# Patient Record
Sex: Male | Born: 1975 | Race: Black or African American | Hispanic: No | Smoking: Never smoker
Health system: Southern US, Community
[De-identification: ages and names within clinical notes are randomized; demographics above are authoritative.]

## PROBLEM LIST (undated history)

## (undated) ENCOUNTER — Emergency Department (HOSPITAL_COMMUNITY): Discharge status: Against Medical Advice | Payer: Self-pay

---

## 2006-05-01 ENCOUNTER — Ambulatory Visit: Payer: Self-pay | Admitting: Family Medicine

## 2006-08-07 ENCOUNTER — Emergency Department: Payer: Self-pay | Admitting: Emergency Medicine

## 2006-08-17 ENCOUNTER — Ambulatory Visit: Payer: Self-pay | Admitting: Family Medicine

## 2006-08-17 DIAGNOSIS — K625 Hemorrhage of anus and rectum: Secondary | ICD-10-CM | POA: Insufficient documentation

## 2006-08-17 DIAGNOSIS — K644 Residual hemorrhoidal skin tags: Secondary | ICD-10-CM | POA: Insufficient documentation

## 2006-11-19 ENCOUNTER — Encounter (INDEPENDENT_AMBULATORY_CARE_PROVIDER_SITE_OTHER): Payer: Self-pay | Admitting: Internal Medicine

## 2006-11-30 ENCOUNTER — Ambulatory Visit: Payer: Self-pay | Admitting: Internal Medicine

## 2006-11-30 DIAGNOSIS — M79609 Pain in unspecified limb: Secondary | ICD-10-CM | POA: Insufficient documentation

## 2006-12-04 ENCOUNTER — Encounter (INDEPENDENT_AMBULATORY_CARE_PROVIDER_SITE_OTHER): Payer: Self-pay | Admitting: Internal Medicine

## 2006-12-06 ENCOUNTER — Encounter (INDEPENDENT_AMBULATORY_CARE_PROVIDER_SITE_OTHER): Payer: Self-pay | Admitting: Internal Medicine

## 2007-01-14 ENCOUNTER — Encounter (INDEPENDENT_AMBULATORY_CARE_PROVIDER_SITE_OTHER): Payer: Self-pay | Admitting: *Deleted

## 2007-02-11 ENCOUNTER — Ambulatory Visit: Payer: Self-pay | Admitting: Family Medicine

## 2007-04-05 ENCOUNTER — Emergency Department (HOSPITAL_COMMUNITY): Admission: EM | Admit: 2007-04-05 | Discharge: 2007-04-06 | Payer: Self-pay | Admitting: Emergency Medicine

## 2007-05-23 ENCOUNTER — Encounter (INDEPENDENT_AMBULATORY_CARE_PROVIDER_SITE_OTHER): Payer: Self-pay | Admitting: Internal Medicine

## 2008-02-24 ENCOUNTER — Emergency Department (HOSPITAL_COMMUNITY): Admission: EM | Admit: 2008-02-24 | Discharge: 2008-02-24 | Payer: Self-pay | Admitting: Emergency Medicine

## 2008-04-05 ENCOUNTER — Emergency Department (HOSPITAL_COMMUNITY): Admission: EM | Admit: 2008-04-05 | Discharge: 2008-04-05 | Payer: Self-pay | Admitting: Family Medicine

## 2008-06-16 ENCOUNTER — Emergency Department (HOSPITAL_COMMUNITY): Admission: EM | Admit: 2008-06-16 | Discharge: 2008-06-16 | Payer: Self-pay | Admitting: Family Medicine

## 2008-07-22 ENCOUNTER — Emergency Department (HOSPITAL_COMMUNITY): Admission: EM | Admit: 2008-07-22 | Discharge: 2008-07-22 | Payer: Self-pay | Admitting: Family Medicine

## 2008-07-27 ENCOUNTER — Emergency Department (HOSPITAL_COMMUNITY): Admission: EM | Admit: 2008-07-27 | Discharge: 2008-07-27 | Payer: Self-pay | Admitting: Family Medicine

## 2008-07-31 ENCOUNTER — Emergency Department: Payer: Self-pay | Admitting: Emergency Medicine

## 2008-08-02 ENCOUNTER — Emergency Department (HOSPITAL_COMMUNITY): Admission: EM | Admit: 2008-08-02 | Discharge: 2008-08-02 | Payer: Self-pay | Admitting: Family Medicine

## 2008-08-05 ENCOUNTER — Ambulatory Visit: Payer: Self-pay | Admitting: Family Medicine

## 2008-08-10 ENCOUNTER — Ambulatory Visit: Payer: Self-pay | Admitting: Psychiatry

## 2008-08-10 ENCOUNTER — Telehealth: Payer: Self-pay | Admitting: Family Medicine

## 2008-08-10 ENCOUNTER — Inpatient Hospital Stay (HOSPITAL_COMMUNITY): Admission: EM | Admit: 2008-08-10 | Discharge: 2008-08-13 | Payer: Self-pay | Admitting: Psychiatry
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
## 2010-06-13 LAB — CBC
HCT: 39.3 % (ref 39.0–52.0)
Hemoglobin: 13.1 g/dL (ref 13.0–17.0)
MCV: 90.1 fL (ref 78.0–100.0)
Platelets: 184 10*3/uL (ref 150–400)
RDW: 12.9 % (ref 11.5–15.5)

## 2010-06-13 LAB — TSH: TSH: 5.056 u[IU]/mL — ABNORMAL HIGH (ref 0.350–4.500)

## 2010-06-13 LAB — COMPREHENSIVE METABOLIC PANEL
Alkaline Phosphatase: 51 U/L (ref 39–117)
BUN: 12 mg/dL (ref 6–23)
Chloride: 105 mEq/L (ref 96–112)
Creatinine, Ser: 1.29 mg/dL (ref 0.4–1.5)
Glucose, Bld: 94 mg/dL (ref 70–99)
Potassium: 3.9 mEq/L (ref 3.5–5.1)
Total Bilirubin: 1 mg/dL (ref 0.3–1.2)
Total Protein: 6.7 g/dL (ref 6.0–8.3)

## 2010-06-15 LAB — POCT I-STAT, CHEM 8
Calcium, Ion: 1.15 mmol/L (ref 1.12–1.32)
HCT: 45 % (ref 39.0–52.0)
Sodium: 140 mEq/L (ref 135–145)
TCO2: 24 mmol/L (ref 0–100)

## 2010-06-15 LAB — D-DIMER, QUANTITATIVE: D-Dimer, Quant: 0.3 ug/mL-FEU (ref 0.00–0.48)

## 2010-06-20 LAB — GC/CHLAMYDIA PROBE AMP, GENITAL
Chlamydia, DNA Probe: NEGATIVE
GC Probe Amp, Genital: POSITIVE — AB

## 2010-07-22 NOTE — Discharge Summary (Signed)
NAME:  MCDANIEL, OHMS NO.:  [id number]   MEDICAL RECORD NO.:  [id number]          PATIENT TYPE:  IPS   LOCATION:  0501                          FACILITY:  [facility]   PHYSICIAN:  Geoffery Lyons, M.D.      DATE OF BIRTH:  01/04/1976   DATE OF ADMISSION:  08/10/2008  DATE OF DISCHARGE:  08/13/2008                               DISCHARGE SUMMARY   CHIEF COMPLAINT/HISTORY OF PRESENT ILLNESS:  This was the first  admission to [facility] for this 35 year old male  married, voluntarily admitted, thoughts of suicide for the last week,  thinking of how he might do this.  Considering several ways, mostly  stabbing and cutting.  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  No  previous attempts, issues with wife, marital infidelity.  She lost her  job, and since then she has been making attempts to find one.  He goes  to work one and a half hours away commute to 12 hours shifts.   PAST PSYCHIATRIC HISTORY:  [facility] time at [facility].  No prior  treatment.  Has had some marital counseling from the counseling from the  pastor.   ALCOHOL AND DRUG HABITS:  Denies active use of any substances.   MEDICAL HISTORY:  Noncontributory.   MEDICATIONS:  None.   PHYSICAL EXAMINATION:  Exam failed to show any acute findings.   LABORATORY WORK:  Mcilvain blood cells 5.5, hemoglobin 13.1, sodium 138,  potassium 3.9, glucose 94, SGOT 17, SGPT 12, total bilirubin 1.0, TSH  5.056.   MENTAL STATUS EXAM:  Reveals alert cooperative male.  Mood depressed.  Affect depressed.  Thought processes logical, coherent and relevant.  Some psychomotor retardation, feeling overwhelmed, suicidal ruminations.  No active plan at the time of this assessment.  No homicidal ideas.  No  delusions.  No hallucinations.  Cognition well-preserved.   ADMISSION DIAGNOSES:  AXIS I:  Major depressive disorder.  AXIS II:  No diagnosis.  AXIS III:  No diagnosis.  AXIS IV: Moderate.  AXIS V:  On admission  35-40, in the last year 70.   COURSE IN THE HOSPITAL:  Was admitted, started individual and group  psychotherapy.  He then endorsed increased depression, father killed  himself, stress from work, stress from the relationship with the wife  due to her infidelity.  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  June 9, endorsed that he realized he got overwhelmed,  hopeless, helpless.  Endorsed that he eventually will need to get  another job.  Would like to transfer. 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  He  was planning to work on relationship with his wife getting set to feel  better.  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  On June 18, he has made turn around.  His mood  was improved.  His affect was brighter.  She was smiling.  Endorsed he  was feeling so much better.  He was willing to pursue outpatient  treatment.  Felt that he was clear in terms of what he needed to do to  get his life back together.  We went ahead and discharged to outpatient  follow-up.   DISCHARGE DIAGNOSES:  AXIS I:  Major depressive disorder.  AXIS II:  No diagnosis.  AXIS III:  No diagnosis.  AXIS IV:  Moderate.  AXIS V:  On discharge 50-55.   DISCHARGE MEDICATIONS:  Discharged on no medications.   FOLLOWUP:  Follow-up through the [facility] counseling.      Geoffery Lyons, M.D.  Electronically Signed     IL/MEDQ  D:  09/10/2008  T:  09/10/2008  Job:  [id number]

## 2010-07-22 NOTE — Assessment & Plan Note (Signed)
[name]                           [place] OFFICE NOTE   NAME:Evan [name], Evan [name]                          MRN:          [id number]  DATE:05/01/2006                            DOB:          04/13/1975    Evan [name] is a 35 year old black male who comes to establish with the  practice, needing followup of pneumonia.  He indicates that he was seen  at the urgent care on April 25, 2006, and by chest x-ray diagnosed  with pneumonia.  He was given clarithromycin and Tamiflu.  He indicates  that he is having no chills now but is still coughing and the mucus is  thinner.   CURRENT MEDICATIONS:  None other than chronic.   ALLERGIES:  None.   PAST MEDICAL HISTORY:  Significant for chicken pox, no measles or mumps,  no other acute or chronic illness.  His only surgery is arthroscopic of  the left knee.  He has had no hospitalizations.   SOCIAL HISTORY:  He is married and has a 19-month-old son.  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  He does cardio and weight lifting  three times a week.  Has never smoked, occasionally uses alcohol, and no  street drugs.   REVIEW OF SYSTEMS:  Basically negative including HEENT, cardiovascular,  respiratory, GI, and GU.  He does indicate that he checks his testicles  on a weekly basis.  MUSCULOSKELETAL:  He had had fractured ankles  bilaterally but no other musculoskeletal problem.  He has had no  thromboses or allergic rhinitis.   FAMILY HISTORY:  Father died at age 54.  He was shot to death.  He had  diabetes.  Mother is living at age 40 with hypertension.  Maternal  grandmother died of stomach cancer.  Paternal grandfather died at 68 of  old age.  Paternal grandmother died in childbirth.  He has two brothers  living - one has GERD, the other is well.  He has one sister living with  hypertension.   With questions regarding the extended family find that there are no  other acute or chronic illnesses to  his knowledge in the family.   IMMUNIZATION RECORD:  His last tetanus was less than 5 years ago.  He  has not had the hepatitis B or pneumonia vaccine.   PHYSICAL EXAMINATION:  VITAL SIGNS:  Blood pressure 102/69, temperature  98.0, pulse is 96, weight is 298, height 5 feet 9-and-a-half inches.  GENERAL:  Obese black male in no acute distress.  HEENT:  TMs retracted bilaterally with fluid present.  Nasal mucosa is  erythematous and edematous.  Posterior pharynx is erythematous, no  exudate.  Facial sinus are not tender.  He has no lymphadenopathy of the  neck.  CHEST:  Clear.  No rales, rhonchi or wheezes are heard.  He does have a  moist, harsh cough which is not productive at this time.  HEART:  Rate and rhythm regular without murmurs, gallops or rubs.  No  carotid bruits.  No pretibial edema.  MUSCULOSKELETAL:  Muscle mass is  well developed throughout.  Gait and  station within normal limits.  SKIN:  Without obvious lesions in the exposed areas.  PSYCHIATRIC:  Oriented x3.  Verbalizes easily.  Is a good historian.   ASSESSMENT:  1. Pneumonia which is resolving.  He will complete the Biaxin and see      back in a week for followup.  Increase his p.o. fluids and may      return to work as needed.  2. Obesity.  This was not addressed at this time.      Billie D. Bean, FNP  Electronically Signed      Arta Silence, MD  Electronically Signed   BDB/MedQ  DD: 05/08/2006  DT: 05/08/2006  Job #: [id number]

## 2011-08-21 ENCOUNTER — Ambulatory Visit (HOSPITAL_COMMUNITY): Admit: 2011-08-21 | Payer: Self-pay | Admitting: Gastroenterology

## 2011-08-21 ENCOUNTER — Encounter (HOSPITAL_COMMUNITY): Payer: Self-pay

## 2011-08-21 SURGERY — EGD (ESOPHAGOGASTRODUODENOSCOPY)
Anesthesia: Moderate Sedation

## 2011-10-25 ENCOUNTER — Emergency Department: Payer: Self-pay | Admitting: Emergency Medicine

## 2013-03-14 ENCOUNTER — Ambulatory Visit: Payer: Self-pay | Admitting: Internal Medicine

## 2013-03-14 DIAGNOSIS — Z0289 Encounter for other administrative examinations: Secondary | ICD-10-CM

## 2019-03-06 ENCOUNTER — Emergency Department (HOSPITAL_COMMUNITY)
Admission: EM | Admit: 2019-03-06 | Discharge: 2019-03-06 | Disposition: A | Discharge status: Home / Self Care | Payer: Self-pay | Attending: Emergency Medicine | Admitting: Emergency Medicine

## 2019-03-06 ENCOUNTER — Encounter (HOSPITAL_COMMUNITY): Payer: Self-pay | Admitting: Emergency Medicine

## 2019-03-06 ENCOUNTER — Other Ambulatory Visit: Payer: Self-pay

## 2019-03-06 DIAGNOSIS — R42 Dizziness and giddiness: Secondary | ICD-10-CM | POA: Insufficient documentation

## 2019-03-06 LAB — CBC
HCT: 43.1 % (ref 39.0–52.0)
Hemoglobin: 13.6 g/dL (ref 13.0–17.0)
MCH: 28.5 pg (ref 26.0–34.0)
MCHC: 31.6 g/dL (ref 30.0–36.0)
MCV: 90.4 fL (ref 80.0–100.0)
Platelets: 202 10*3/uL (ref 150–400)
RBC: 4.77 MIL/uL (ref 4.22–5.81)
RDW: 13.9 % (ref 11.5–15.5)
WBC: 5.4 10*3/uL (ref 4.0–10.5)
nRBC: 0 % (ref 0.0–0.2)

## 2019-03-06 LAB — BASIC METABOLIC PANEL
Anion gap: 10 (ref 5–15)
BUN: 14 mg/dL (ref 6–20)
CO2: 23 mmol/L (ref 22–32)
Calcium: 8.6 mg/dL — ABNORMAL LOW (ref 8.9–10.3)
Chloride: 103 mmol/L (ref 98–111)
Creatinine, Ser: 1.07 mg/dL (ref 0.61–1.24)
GFR calc Af Amer: >60 mL/min (ref >60)
GFR calc non Af Amer: >60 mL/min (ref >60)
Glucose, Bld: 82 mg/dL (ref 70–99)
Potassium: 4.4 mmol/L (ref 3.5–5.1)
Sodium: 136 mmol/L (ref 135–145)

## 2019-03-06 LAB — CBG MONITORING, ED: Glucose-Capillary: 79 mg/dL (ref 70–99)

## 2019-03-06 NOTE — ED Provider Notes (Signed)
[facility]-EMERGENCY DEPT Provider Note   CSN: [id number] Arrival date & time: 03/06/19  1315     History Chief Complaint  Patient presents with  . Dizziness    Evan [name] is a 43 y.o. male.  Presents to ER with chief complaint lightheadedness.  Episode of lightheadedness occurred after his friend made him a very sugary smoothie.  States he normally does not have smoothies like this, normally does not eat somewhat sugar.  No alcoholic drink.  States felt lightheaded for about an hour then this resolved spontaneously.  No syncope, no room spinning sensation, no numbness, weakness, vision changes, no chest pain or difficulty breathing.  States he feels fine at this time and has no ongoing complaints.  Denies any chronic medical problems.  No known food allergies.  HPI     History reviewed. No pertinent past medical history.  There are no problems to display for this patient.   History reviewed. No pertinent surgical history.     No family history on file.  Social History   Tobacco Use  . Smoking status: Never Smoker  . Smokeless tobacco: Never Used  Substance Use Topics  . Alcohol use: Yes  . Drug use: Not on file    Home Medications Prior to Admission medications   Not on File    Allergies    Penicillins  Review of Systems   Review of Systems  Constitutional: Negative for chills and fever.  HENT: Negative for ear pain and sore throat.   Eyes: Negative for pain and visual disturbance.  Respiratory: Negative for cough and shortness of breath.   Cardiovascular: Negative for chest pain and palpitations.  Gastrointestinal: Negative for abdominal pain and vomiting.  Genitourinary: Negative for dysuria and hematuria.  Musculoskeletal: Negative for arthralgias and back pain.  Skin: Negative for color change and rash.  Neurological: Positive for light-headedness. Negative for seizures and syncope.  All other systems reviewed and are  negative.   Physical Exam Updated Vital Signs BP (!) 130/56 (BP Location: Left Arm)   Pulse 75   Temp 98.1 F (36.7 C) (Oral)   Resp 18   SpO2 98%   Physical Exam Vitals and nursing note reviewed.  Constitutional:      Appearance: He is well-developed.  HENT:     Head: Normocephalic and atraumatic.  Eyes:     Conjunctiva/sclera: Conjunctivae normal.  Cardiovascular:     Rate and Rhythm: Normal rate and regular rhythm.     Heart sounds: No murmur.  Pulmonary:     Effort: Pulmonary effort is normal. No respiratory distress.     Breath sounds: Normal breath sounds.  Abdominal:     Palpations: Abdomen is soft.     Tenderness: There is no abdominal tenderness.  Musculoskeletal:     Cervical back: Neck supple.  Skin:    General: Skin is warm and dry.  Neurological:     General: No focal deficit present.     Mental Status: He is alert and oriented to person, place, and time. Mental status is at baseline.     Cranial Nerves: No cranial nerve deficit.     Sensory: No sensory deficit.     Motor: No weakness.     Coordination: Coordination normal.     Gait: Gait normal.  Psychiatric:        Mood and Affect: Mood normal.        Behavior: Behavior normal.     ED Results / Procedures /  Treatments   Labs (all labs ordered are listed, but only abnormal results are displayed) Labs Reviewed  BASIC METABOLIC PANEL - Abnormal; Notable for the following components:      Result Value   Calcium 8.6 (*)    All other components within normal limits  CBC  URINALYSIS, ROUTINE W REFLEX MICROSCOPIC  CBG MONITORING, ED    EKG EKG Interpretation  Date/Time:  Thursday March 06 2019 13:23:43 EST Ventricular Rate:  65 PR Interval:    QRS Duration: 99 QT Interval:  403 QTC Calculation: 419 R Axis:   1 Text Interpretation: Sinus rhythm Abnormal R-wave progression, early transition Inferior infarct, old Borderline ST elevation, anterolateral leads no acute STEMI no prior for  comparison Confirmed by Madalyn Rob [phone]) on 03/06/2019 3:10:46 PM   Radiology No results found.  Procedures Procedures (including critical care time)  Medications Ordered in ED Medications - No data to display  ED Course  I have reviewed the triage vital signs and the nursing notes.  Pertinent labs & imaging results that were available during my care of the patient were reviewed by me and considered in my medical decision making (see chart for details).  Clinical Course as of Mar 05 1702  Thu Mar 06, 2019  1602 Complete initial assessment, patient well-appearing, normal exam, will discharge home, no ongoing symptoms   [RD]    Clinical Course User Index [RD] Lucrezia Starch, MD   MDM Rules/Calculators/A&P                      43 year old male presents to ER after episode of lightheadedness after trying new smoothie drink.  Episode resolved spontaneously, he has no ongoing symptoms, no neurologic deficits, no associated chest pain or difficulty breathing, no syncope.  His vital signs are stable.  Believe patient can be discharged at this time and follow-up with his primary doctor.    After the discussed management above, the patient was determined to be safe for discharge.  The patient was in agreement with this plan and all questions regarding their care were answered.  ED return precautions were discussed and the patient will return to the ED with any significant worsening of condition.   Final Clinical Impression(s) / ED Diagnoses Final diagnoses:  Lightheadedness    Rx / DC Orders ED Discharge Orders    None       Lucrezia Starch, MD 03/06/19 1704

## 2019-03-06 NOTE — ED Triage Notes (Signed)
Pt reports that his friend fixed him a smoothie. Ever since he drank it felt dizzy for the past hour. Reports that hasnt had refined sugars in a while and doesn't know if that is the cause.

## 2019-03-06 NOTE — Discharge Instructions (Signed)
Recommend follow-up with primary doctor.  Return to ER if you have any episode of chest pain, difficulty breathing, passing out, dizziness or other new concerning symptom.

## 2019-03-06 NOTE — ED Notes (Signed)
Patient has a extra blue top in the main lab 

## 2020-01-02 ENCOUNTER — Inpatient Hospital Stay (HOSPITAL_COMMUNITY)
Admission: EM | Admit: 2020-01-02 | Discharge: 2020-01-06 | DRG: 177 | Disposition: A | Discharge status: Home / Self Care | Payer: Medicaid - Out of State | Attending: Internal Medicine | Admitting: Internal Medicine

## 2020-01-02 ENCOUNTER — Emergency Department (HOSPITAL_COMMUNITY): Payer: Medicaid - Out of State

## 2020-01-02 ENCOUNTER — Other Ambulatory Visit: Payer: Self-pay

## 2020-01-02 ENCOUNTER — Encounter (HOSPITAL_COMMUNITY): Payer: Self-pay | Admitting: Emergency Medicine

## 2020-01-02 DIAGNOSIS — N182 Chronic kidney disease, stage 2 (mild): Secondary | ICD-10-CM

## 2020-01-02 DIAGNOSIS — J1282 Pneumonia due to coronavirus disease 2019: Secondary | ICD-10-CM | POA: Diagnosis present

## 2020-01-02 DIAGNOSIS — E669 Obesity, unspecified: Secondary | ICD-10-CM

## 2020-01-02 DIAGNOSIS — U071 COVID-19: Secondary | ICD-10-CM | POA: Diagnosis present

## 2020-01-02 DIAGNOSIS — Z880 Allergy status to penicillin: Secondary | ICD-10-CM

## 2020-01-02 DIAGNOSIS — Z6841 Body Mass Index (BMI) 40.0 and over, adult: Secondary | ICD-10-CM | POA: Diagnosis not present

## 2020-01-02 DIAGNOSIS — J9601 Acute respiratory failure with hypoxia: Secondary | ICD-10-CM | POA: Diagnosis present

## 2020-01-02 DIAGNOSIS — E66812 Obesity, class 2: Secondary | ICD-10-CM

## 2020-01-02 DIAGNOSIS — D631 Anemia in chronic kidney disease: Secondary | ICD-10-CM | POA: Diagnosis present

## 2020-01-02 LAB — CBC WITH DIFFERENTIAL/PLATELET
Abs Immature Granulocytes: 0.02 10*3/uL (ref 0.00–0.07)
Basophils Absolute: 0 10*3/uL (ref 0.0–0.1)
Basophils Relative: 0 %
Eosinophils Absolute: 0 10*3/uL (ref 0.0–0.5)
Eosinophils Relative: 0 %
HCT: 37.6 % — ABNORMAL LOW (ref 39.0–52.0)
Hemoglobin: 12.3 g/dL — ABNORMAL LOW (ref 13.0–17.0)
Immature Granulocytes: 0 %
Lymphocytes Relative: 15 %
Lymphs Abs: 1 10*3/uL (ref 0.7–4.0)
MCH: 28.7 pg (ref 26.0–34.0)
MCHC: 32.7 g/dL (ref 30.0–36.0)
MCV: 87.6 fL (ref 80.0–100.0)
Monocytes Absolute: 0.4 10*3/uL (ref 0.1–1.0)
Monocytes Relative: 7 %
Neutro Abs: 5 10*3/uL (ref 1.7–7.7)
Neutrophils Relative %: 78 %
Platelets: 245 10*3/uL (ref 150–400)
RBC: 4.29 MIL/uL (ref 4.22–5.81)
RDW: 13.7 % (ref 11.5–15.5)
WBC: 6.4 10*3/uL (ref 4.0–10.5)
nRBC: 0 % (ref 0.0–0.2)

## 2020-01-02 LAB — COMPREHENSIVE METABOLIC PANEL
ALT: 31 U/L (ref 0–44)
AST: 56 U/L — ABNORMAL HIGH (ref 15–41)
Albumin: 3.8 g/dL (ref 3.5–5.0)
Alkaline Phosphatase: 31 U/L — ABNORMAL LOW (ref 38–126)
Anion gap: 13 (ref 5–15)
BUN: 12 mg/dL (ref 6–20)
CO2: 22 mmol/L (ref 22–32)
Calcium: 8.4 mg/dL — ABNORMAL LOW (ref 8.9–10.3)
Chloride: 101 mmol/L (ref 98–111)
Creatinine, Ser: 1.26 mg/dL — ABNORMAL HIGH (ref 0.61–1.24)
GFR, Estimated: >60 mL/min (ref >60)
Glucose, Bld: 96 mg/dL (ref 70–99)
Potassium: 3.9 mmol/L (ref 3.5–5.1)
Sodium: 136 mmol/L (ref 135–145)
Total Bilirubin: 1.6 mg/dL — ABNORMAL HIGH (ref 0.3–1.2)
Total Protein: 7.8 g/dL (ref 6.5–8.1)

## 2020-01-02 LAB — FERRITIN: Ferritin: 1484 ng/mL — ABNORMAL HIGH (ref 24–336)

## 2020-01-02 LAB — PROCALCITONIN: Procalcitonin: <0.1 ng/mL

## 2020-01-02 LAB — LACTIC ACID, PLASMA: Lactic Acid, Venous: 1 mmol/L (ref 0.5–1.9)

## 2020-01-02 LAB — TRIGLYCERIDES: Triglycerides: 86 mg/dL (ref <150)

## 2020-01-02 LAB — FIBRINOGEN: Fibrinogen: 628 mg/dL — ABNORMAL HIGH (ref 210–475)

## 2020-01-02 LAB — C-REACTIVE PROTEIN: CRP: 21.3 mg/dL — ABNORMAL HIGH (ref <1.0)

## 2020-01-02 LAB — D-DIMER, QUANTITATIVE: D-Dimer, Quant: 1.1 ug/mL-FEU — ABNORMAL HIGH (ref 0.00–0.50)

## 2020-01-02 LAB — LACTATE DEHYDROGENASE: LDH: 381 U/L — ABNORMAL HIGH (ref 98–192)

## 2020-01-02 MED ORDER — DEXAMETHASONE SODIUM PHOSPHATE 10 MG/ML IJ SOLN
6.0000 mg | INTRAMUSCULAR | Status: DC
  Administered 2020-01-03: 6 mg via INTRAVENOUS
  Filled 2020-01-02: qty 1

## 2020-01-02 MED ORDER — ONDANSETRON HCL 4 MG PO TABS: 4.0000 mg | ORAL_TABLET | Freq: Four times a day (QID) | ORAL | Status: DC | PRN

## 2020-01-02 MED ORDER — BENZONATATE 100 MG PO CAPS
100.0000 mg | ORAL_CAPSULE | Freq: Once | ORAL | Status: AC
  Administered 2020-01-03: 100 mg via ORAL
  Filled 2020-01-02: qty 1

## 2020-01-02 MED ORDER — ONDANSETRON HCL 4 MG/2ML IJ SOLN: 4.0000 mg | Freq: Four times a day (QID) | INTRAMUSCULAR | Status: DC | PRN

## 2020-01-02 MED ORDER — HEPARIN SODIUM (PORCINE) 5000 UNIT/ML IJ SOLN: 5000.0000 [IU] | Freq: Three times a day (TID) | INTRAMUSCULAR | Status: DC

## 2020-01-02 MED ORDER — POLYETHYLENE GLYCOL 3350 17 G PO PACK
17.0000 g | PACK | Freq: Every day | ORAL | Status: DC | PRN
  Administered 2020-01-05: 17 g via ORAL
  Filled 2020-01-02: qty 1

## 2020-01-02 MED ORDER — ZINC SULFATE 220 (50 ZN) MG PO CAPS
220.0000 mg | ORAL_CAPSULE | Freq: Every day | ORAL | Status: DC
  Administered 2020-01-03 – 2020-01-06 (×4): 220 mg via ORAL
  Filled 2020-01-02 (×4): qty 1

## 2020-01-02 MED ORDER — ACETAMINOPHEN 325 MG PO TABS
650.0000 mg | ORAL_TABLET | Freq: Four times a day (QID) | ORAL | Status: DC | PRN
  Administered 2020-01-03: 650 mg via ORAL
  Filled 2020-01-02 (×2): qty 2

## 2020-01-02 MED ORDER — TRAMADOL HCL 50 MG PO TABS: 50.0000 mg | ORAL_TABLET | Freq: Four times a day (QID) | ORAL | Status: DC | PRN

## 2020-01-02 MED ORDER — SODIUM CHLORIDE 0.9 % IV SOLN
100.0000 mg | Freq: Every day | INTRAVENOUS | Status: AC
  Administered 2020-01-03 – 2020-01-06 (×4): 100 mg via INTRAVENOUS
  Filled 2020-01-02 (×3): qty 20

## 2020-01-02 MED ORDER — IPRATROPIUM-ALBUTEROL 20-100 MCG/ACT IN AERS
1.0000 | INHALATION_SPRAY | Freq: Four times a day (QID) | RESPIRATORY_TRACT | Status: DC
  Administered 2020-01-03 – 2020-01-06 (×14): 1 via RESPIRATORY_TRACT
  Filled 2020-01-02: qty 4

## 2020-01-02 MED ORDER — GUAIFENESIN-DM 100-10 MG/5ML PO SYRP
10.0000 mL | ORAL_SOLUTION | ORAL | Status: DC | PRN
  Administered 2020-01-03 – 2020-01-05 (×6): 10 mL via ORAL
  Filled 2020-01-02 (×6): qty 10

## 2020-01-02 MED ORDER — HYDROCOD POLST-CPM POLST ER 10-8 MG/5ML PO SUER: 5.0000 mL | Freq: Two times a day (BID) | ORAL | Status: DC | PRN

## 2020-01-02 MED ORDER — SODIUM CHLORIDE 0.9 % IV SOLN
200.0000 mg | Freq: Once | INTRAVENOUS | Status: AC
  Administered 2020-01-02: 200 mg via INTRAVENOUS
  Filled 2020-01-02: qty 200

## 2020-01-02 MED ORDER — ACETAMINOPHEN 325 MG PO TABS
650.0000 mg | ORAL_TABLET | Freq: Once | ORAL | Status: AC
  Administered 2020-01-02: 650 mg via ORAL
  Filled 2020-01-02: qty 2

## 2020-01-02 MED ORDER — ASCORBIC ACID 500 MG PO TABS
500.0000 mg | ORAL_TABLET | Freq: Every day | ORAL | Status: DC
  Administered 2020-01-03 – 2020-01-06 (×4): 500 mg via ORAL
  Filled 2020-01-02 (×4): qty 1

## 2020-01-02 NOTE — ED Notes (Addendum)
ED TO INPATIENT HANDOFF REPORT  ED Nurse Name and Phone #: Merilyn 5W  S Name/Age/Gender Evan SayresJames [name] 44 y.o. male Room/Bed: WA20/WA20  Code Status   Code Status: Full Code  Home/SNF/Other Home Patient oriented to: self, place, time and situation Is this baseline? Yes   Triage Complete: Triage complete  Chief Complaint Pneumonia due to COVID-19 virus [U07.1, J12.82]  Triage Note Per EMS, patient from UC. Dx with Covid on 10/28 and [facility]. O2 saturation 84% while ambulating.    Allergies Allergies  Allergen Reactions  . Penicillins     States he is allergic to liquid penicillins    Level of Care/Admitting Diagnosis ED Disposition    ED Disposition Condition Comment   Admit  [name] [[id number]]  Level of Care: Telemetry [5]  Admit to tele based on following criteria: Other see comments  Comments: hypoxia  May [facility] to [facility] or [facility] if equivalent level of care is available:: Yes  Covid Evaluation: Confirmed COVID Positive  Diagnosis: Pneumonia due to COVID-19 virus [[id number]][[phone]]  Admitting Physician: Lilyan GilfordZIERLE-GHOSH, ASIA B [[id number]]  Attending Physician: Lilyan GilfordZIERLE-GHOSH, ASIA B [[id number]]  Estimated length of stay: past midnight tomorrow  Certification:: I certify this patient will need inpatient services for at least 2 midnights       B Medical/Surgery History History reviewed. No pertinent past medical history.    A IV Location/Drains/Wounds Patient Lines/Drains/Airways Status    Active Line/Drains/Airways    Name Placement date Placement time Site Days   Peripheral IV 01/02/20 Anterior;Distal;Right;Upper Arm 01/02/20  2045  Arm  less than 1          Intake/Output Last 24 hours No intake or output data in the 24 hours ending 01/02/20 2355  Labs/Imaging Results for orders placed or performed during the hospital encounter of 01/02/20 (from the past 48 hour(s))  Lactic acid, plasma      Status: None   Collection Time: 01/02/20  8:40 PM  Result Value Ref Range   Lactic Acid, Venous 1.0 0.5 - 1.9 mmol/L    Comment: Performed at [facility], [address] [place], [place] [address]  CBC WITH DIFFERENTIAL     Status: Abnormal   Collection Time: 01/02/20  8:40 PM  Result Value Ref Range   WBC 6.4 4.0 - 10.5 K/uL   RBC 4.29 4.22 - 5.81 MIL/uL   Hemoglobin 12.3 (L) 13.0 - 17.0 g/dL   HCT 65.737.6 (L) 39 - 52 %   MCV 87.6 80.0 - 100.0 fL   MCH 28.7 26.0 - 34.0 pg   MCHC 32.7 30.0 - 36.0 g/dL   RDW 84.613.7 96.211.5 - 95.215.5 %   Platelets 245 150 - 400 K/uL   nRBC 0.0 0.0 - 0.2 %   Neutrophils Relative % 78 %   Neutro Abs 5.0 1.7 - 7.7 K/uL   Lymphocytes Relative 15 %   Lymphs Abs 1.0 0.7 - 4.0 K/uL   Monocytes Relative 7 %   Monocytes Absolute 0.4 0.1 - 1.0 K/uL   Eosinophils Relative 0 %   Eosinophils Absolute 0.0 0.0 - 0.5 K/uL   Basophils Relative 0 %   Basophils Absolute 0.0 0.0 - 0.1 K/uL   Immature Granulocytes 0 %   Abs Immature Granulocytes 0.02 0.00 - 0.07 K/uL    Comment: Performed at [facility], [address] [place], [place] [address]  Comprehensive metabolic panel     Status: Abnormal  Collection Time: 01/02/20  8:40 PM  Result Value Ref Range   Sodium 136 135 - 145 mmol/L   Potassium 3.9 3.5 - 5.1 mmol/L   Chloride 101 98 - 111 mmol/L   CO2 22 22 - 32 mmol/L   Glucose, Bld 96 70 - 99 mg/dL    Comment: Glucose reference range applies only to samples taken after fasting for at least 8 hours.   BUN 12 6 - 20 mg/dL   Creatinine, Ser 7.90 (H) 0.61 - 1.24 mg/dL   Calcium 8.4 (L) 8.9 - 10.3 mg/dL   Total Protein 7.8 6.5 - 8.1 g/dL   Albumin 3.8 3.5 - 5.0 g/dL   AST 56 (H) 15 - 41 U/L   ALT 31 0 - 44 U/L   Alkaline Phosphatase 31 (L) 38 - 126 U/L   Total Bilirubin 1.6 (H) 0.3 - 1.2 mg/dL   GFR, Estimated >24 >09 mL/min    Comment: (NOTE) Calculated using the CKD-EPI Creatinine Equation (2021)    Anion gap 13 5 - 15     Comment: Performed at [facility], [address], [place], [place] [address]  D-dimer, quantitative     Status: Abnormal   Collection Time: 01/02/20  8:40 PM  Result Value Ref Range   D-Dimer, Quant 1.10 (H) 0.00 - 0.50 ug/mL-FEU    Comment: (NOTE) At the manufacturer cut-off value of 0.5 g/mL FEU, this assay has a negative predictive value of 95-100%.This assay is intended for use in conjunction with a clinical pretest probability (PTP) assessment model to exclude pulmonary embolism (PE) and deep venous thrombosis (DVT) in outpatients suspected of PE or DVT. Results should be correlated with clinical presentation. Performed at [facility], [address], [place], [place] [address]   Procalcitonin     Status: None   Collection Time: 01/02/20  8:40 PM  Result Value Ref Range   Procalcitonin <0.10 ng/mL    Comment:        Interpretation: PCT (Procalcitonin) <= 0.5 ng/mL: Systemic infection (sepsis) is not likely. Local bacterial infection is possible. (NOTE)       Sepsis PCT Algorithm           Lower Respiratory Tract                                      Infection PCT Algorithm    ----------------------------     ----------------------------         PCT < 0.25 ng/mL                PCT < 0.10 ng/mL          Strongly encourage             Strongly discourage   discontinuation of antibiotics    initiation of antibiotics    ----------------------------     -----------------------------       PCT 0.25 - 0.50 ng/mL            PCT 0.10 - 0.25 ng/mL               OR       >80% decrease in PCT            Discourage initiation of  antibiotics      Encourage discontinuation           of antibiotics    ----------------------------     -----------------------------         PCT >= 0.50 ng/mL              PCT 0.26 - 0.50 ng/mL               AND        <80% decrease in PCT             Encourage initiation  of                                             antibiotics       Encourage continuation           of antibiotics    ----------------------------     -----------------------------        PCT >= 0.50 ng/mL                  PCT > 0.50 ng/mL               AND         increase in PCT                  Strongly encourage                                      initiation of antibiotics    Strongly encourage escalation           of antibiotics                                     -----------------------------                                           PCT <= 0.25 ng/mL                                                 OR                                        > 80% decrease in PCT                                      Discontinue / Do not initiate                                             antibiotics  Performed at [facility], [address] [place], [place] [address]   Lactate dehydrogenase     Status: Abnormal   Collection  Time: 01/02/20  8:40 PM  Result Value Ref Range   LDH 381 (H) 98 - 192 U/L    Comment: Performed at [facility], [address], [place], [place] [address]  Ferritin     Status: Abnormal   Collection Time: 01/02/20  8:40 PM  Result Value Ref Range   Ferritin 1,484 (H) 24 - 336 ng/mL    Comment: Performed at [facility], [address] [place], [place] [address]  Triglycerides     Status: None   Collection Time: 01/02/20  8:40 PM  Result Value Ref Range   Triglycerides 86 <150 mg/dL    Comment: Performed at [facility], [name], [place], [place] [address]  Fibrinogen     Status: Abnormal   Collection Time: 01/02/20  8:40 PM  Result Value Ref Range   Fibrinogen 628 (H) 210 - 475 mg/dL    Comment: Performed at [facility], [address], [place], [place] [address]  C-reactive protein     Status: Abnormal   Collection Time: 01/02/20  8:40 PM  Result Value Ref  Range   CRP 21.3 (H) <1.0 mg/dL    Comment: Performed at [facility] [address] [place], [place] [address]   DG Chest Portable 1 View  Result Date: 01/02/2020 CLINICAL DATA:  Recent COVID diagnosis. EXAM: PORTABLE CHEST 1 VIEW COMPARISON:  None. FINDINGS: Decreased lung volumes are seen which is likely, in part, secondary to the degree of patient inspiration. Very mild, ill-defined infiltrates are seen within the bilateral lung bases. There is no evidence of a pleural effusion or pneumothorax. The heart size and mediastinal contours are within normal limits. The visualized skeletal structures are unremarkable. IMPRESSION: Very mild, ill-defined bibasilar infiltrates. Electronically Signed   By: Aram Candela M.D.   On: 01/02/2020 22:26    Pending Labs Unresulted Labs (From admission, onward)          Start     Ordered   01/03/20 0500  CBC with Differential/Platelet  Daily,   R      01/02/20 2238   01/03/20 0500  Comprehensive metabolic panel  Daily,   R      01/02/20 2238   01/03/20 0500  C-reactive protein  Daily,   R      01/02/20 2238   01/03/20 0500  D-dimer, quantitative (not at [facility])  Daily,   R      01/02/20 2238   01/03/20 0500  Ferritin  Daily,   R      01/02/20 2238   01/03/20 0500  Magnesium  Daily,   R      01/02/20 2238   01/02/20 2239  HIV Antibody (routine testing w rflx)  (HIV Antibody (Routine testing w reflex) panel)  Once,   STAT        01/02/20 2238   01/02/20 2239  SARS CORONAVIRUS 2 (TAT 6-24 HRS) Nasopharyngeal Nasopharyngeal Swab  Once,   STAT       Question Answer Comment  Is this test for diagnosis or screening Diagnosis of ill patient   Symptomatic for COVID-19 as defined by CDC Yes   Date of Symptom Onset 12/29/2019   Hospitalized for COVID-19 Yes   Admitted to ICU for COVID-19 No   Previously tested for COVID-19 No   Resident in a congregate (group) care setting No   Employed in healthcare setting Unknown   Has patient  completed COVID vaccination(s) (2  doses of Pfizer/Moderna 1 dose of [employer]) Unknown      01/02/20 2238   01/02/20 2239  Procalcitonin  Once,   STAT        01/02/20 2238   01/02/20 1906  Lactic acid, plasma  Now then every 2 hours,   STAT      01/02/20 1905   01/02/20 1906  Blood Culture (routine x 2)  BLOOD CULTURE X 2,   STAT      01/02/20 1905          Vitals/Pain Today's Vitals   01/02/20 1801 01/02/20 1919 01/02/20 2042 01/02/20 2202  BP:  122/71 120/61 (!) 123/59  Pulse:  94 88 87  Resp:  (!) 22 20 (!) 28  Temp:  (!) 101.2 F (38.4 C) 100.2 F (37.9 C)   TempSrc:  Oral    SpO2:  95% 96% 93%  Weight:      Height:      PainSc: 8        Isolation Precautions Airborne and Contact precautions  Medications Medications  remdesivir 200 mg in sodium chloride 0.9% 250 mL IVPB (200 mg Intravenous New Bag/Given 01/02/20 2101)    Followed by  remdesivir 100 mg in sodium chloride 0.9 % 100 mL IVPB (has no administration in time range)  heparin injection 5,000 Units (has no administration in time range)  dexamethasone (DECADRON) injection 6 mg (has no administration in time range)  Ipratropium-Albuterol (COMBIVENT) respimat 1 puff (has no administration in time range)  guaiFENesin-dextromethorphan (ROBITUSSIN DM) 100-10 MG/5ML syrup 10 mL (has no administration in time range)  chlorpheniramine-HYDROcodone (TUSSIONEX) 10-8 MG/5ML suspension 5 mL (has no administration in time range)  ascorbic acid (VITAMIN C) tablet 500 mg (has no administration in time range)  zinc sulfate capsule 220 mg (has no administration in time range)  acetaminophen (TYLENOL) tablet 650 mg (has no administration in time range)  traMADol (ULTRAM) tablet 50 mg (has no administration in time range)  polyethylene glycol (MIRALAX / GLYCOLAX) packet 17 g (has no administration in time range)  ondansetron (ZOFRAN) tablet 4 mg (has no administration in time range)    Or  ondansetron (ZOFRAN)  injection 4 mg (has no administration in time range)  benzonatate (TESSALON) capsule 100 mg (has no administration in time range)  acetaminophen (TYLENOL) tablet 650 mg (650 mg Oral Given 01/02/20 1855)    Mobility walks Low fall risk   Focused Assessments Pulmonary Assessment Handoff:  Lung sounds:   O2 Device: Room Air        R Recommendations: See Admitting Provider Note  Report given to:   Additional Notes:

## 2020-01-02 NOTE — ED Provider Notes (Signed)
[facility]-EMERGENCY DEPT Provider Note   CSN: [id number] Arrival date & time: 01/02/20  1738     History Chief Complaint  Patient presents with  . Covid Positive    Evan [name] is a 44 y.o. male with noncontributory past medical history. Did not have Covid vaccinations.  HPI Patient presents to emergency department today via EMS from urgent care with chief complaint of being Covid positive.  Patient states he tested positive for Covid x2 days ago.  He has been having symptoms for x1 week.  He is endorsing nonproductive cough, intermittent fever, shortness of breath decreased p.o. intake. He reports last known fever this morning. He states he feels extremely short of breath and cannot complete his normal daily activities because of it. He was not taking medications at home for his symptoms. At [facility] he had a chest xray that had impression of bilateral infiltrates. He was given IM rocephin and IM kenalog prior to arrival. He denies chills, chest pain, abdominal pain, nausea, emesis, urinary symptoms, diarrhea.    History reviewed. No pertinent past medical history.  Patient Active Problem List   Diagnosis Date Noted  . Pneumonia due to COVID-19 virus 01/02/2020  . TOE PAIN 11/30/2006  . HEMORRHOIDS, EXTERNAL 08/17/2006  . RECTAL BLEEDING 08/17/2006      History reviewed. No pertinent family history.  Social History   Tobacco Use  . Smoking status: Not on file  Substance Use Topics  . Alcohol use: Not on file  . Drug use: Not on file    Home Medications Prior to Admission medications   Not on File    Allergies    Penicillins  Review of Systems   Review of Systems All other systems are reviewed and are negative for acute change except as noted in the HPI.  Physical Exam Updated Vital Signs BP 135/80 (BP Location: Left Arm)   Pulse 100   Temp (!) 101.6 F (38.7 C) (Oral)   Resp (!) 22   Ht 6' (1.829 m)   Wt 133.8 kg   SpO2 95%   BMI  40.01 kg/m   Physical Exam Vitals and nursing note reviewed.  Constitutional:      General: He is not in acute distress.    Appearance: He is obese. He is ill-appearing.  HENT:     Head: Normocephalic and atraumatic.     Right Ear: Tympanic membrane and external ear normal.     Left Ear: Tympanic membrane and external ear normal.     Nose: Nose normal.     Mouth/Throat:     Mouth: Mucous membranes are moist.     Pharynx: Oropharynx is clear.  Eyes:     General: No scleral icterus.       Right eye: No discharge.        Left eye: No discharge.     Extraocular Movements: Extraocular movements intact.     Conjunctiva/sclera: Conjunctivae normal.     Pupils: Pupils are equal, round, and reactive to light.  Neck:     Vascular: No JVD.  Cardiovascular:     Rate and Rhythm: Normal rate and regular rhythm.     Pulses: Normal pulses.          Radial pulses are 2+ on the right side and 2+ on the left side.     Heart sounds: Normal heart sounds.  Pulmonary:     Comments: Lungs sounds diminished throughout Symmetric chest rise. No wheezing, rales, or rhonchi.  Tachypneic. Speaking in short sentences. No accessory muscle use Abdominal:     Comments: Abdomen is soft, non-distended, and non-tender in all quadrants. No rigidity, no guarding. No peritoneal signs.  Musculoskeletal:        General: Normal range of motion.     Cervical back: Normal range of motion.  Skin:    General: Skin is warm and dry.     Capillary Refill: Capillary refill takes less than 2 seconds.  Neurological:     Mental Status: He is oriented to person, place, and time.     GCS: GCS eye subscore is 4. GCS verbal subscore is 5. GCS motor subscore is 6.     Comments: Fluent speech, no facial droop.  Psychiatric:        Behavior: Behavior normal.     ED Results / Procedures / Treatments   Labs (all labs ordered are listed, but only abnormal results are displayed) Labs Reviewed  CBC WITH DIFFERENTIAL/PLATELET -  Abnormal; Notable for the following components:      Result Value   Hemoglobin 12.3 (*)    HCT 37.6 (*)    All other components within normal limits  COMPREHENSIVE METABOLIC PANEL - Abnormal; Notable for the following components:   Creatinine, Ser 1.26 (*)    Calcium 8.4 (*)    AST 56 (*)    Alkaline Phosphatase 31 (*)    Total Bilirubin 1.6 (*)    All other components within normal limits  D-DIMER, QUANTITATIVE (NOT AT [facility]) - Abnormal; Notable for the following components:   D-Dimer, Quant 1.10 (*)    All other components within normal limits  LACTATE DEHYDROGENASE - Abnormal; Notable for the following components:   LDH 381 (*)    All other components within normal limits  FERRITIN - Abnormal; Notable for the following components:   Ferritin 1,484 (*)    All other components within normal limits  FIBRINOGEN - Abnormal; Notable for the following components:   Fibrinogen 628 (*)    All other components within normal limits  C-REACTIVE PROTEIN - Abnormal; Notable for the following components:   CRP 21.3 (*)    All other components within normal limits  CULTURE, BLOOD (ROUTINE X 2)  CULTURE, BLOOD (ROUTINE X 2)  LACTIC ACID, PLASMA  TRIGLYCERIDES  LACTIC ACID, PLASMA  PROCALCITONIN    EKG EKG Interpretation  Date/Time:  Friday January 02 2020 19:18:10 EDT Ventricular Rate:  91 PR Interval:    QRS Duration: 101 QT Interval:  355 QTC Calculation: 437 R Axis:   11 Text Interpretation: Sinus rhythm Abnormal R-wave progression, early transition Inferolateral infarct, old No previous ECGs available Confirmed by Vanetta Mulders [phone]) on 01/02/2020 8:21:38 PM   Radiology No results found.        Procedures Procedures (including critical care time)  Medications Ordered in ED Medications  remdesivir 200 mg in sodium chloride 0.9% 250 mL IVPB (200 mg Intravenous New Bag/Given 01/02/20 2101)    Followed by  remdesivir 100 mg in sodium chloride 0.9 % 100 mL IVPB  (has no administration in time range)  benzonatate (TESSALON) capsule 100 mg (has no administration in time range)  acetaminophen (TYLENOL) tablet 650 mg (650 mg Oral Given 01/02/20 1855)        ED Course  I have reviewed the triage vital signs and the nursing notes.  Pertinent labs & imaging results that were available during my care of the patient were reviewed by me and considered in my medical  decision making (see chart for details).    MDM Rules/Calculators/A&P                          History provided by patient with additional history obtained from chart review.    44 year old male presenting with Covid.  On arrival he is febrile to 101.6.  On exam he is ill-appearing.  He is tachypneic and speaking in short sentences.  Lung sounds are diminished throughout.  He has no abdominal tenderness, no peritoneal signs.  Tylenol given for fever. Remdesivir ordered per pharmacy consult. Will not repeat chest x-ray or Covid test as pictures of the results have been included in the chart.  When patient ambulated he desatted to 84% on room air.  He was tachypneic with a respiratory rate in the high 40s and tachycardic with a heart rate in the 120s.  Once back in bed after several minutes energy improved to 94%. Suspect Covid admission labs ordered.  Labs without leukocytosis, no significant electrolyte derangement, creatinine 1.26 consistent with baseline. Inflammatory markers elevated, consistent with covid. EKG without STEMI, no prior to compare. This case was discussed with Dr. Deretha Emory who agrees with plan to admit given his hypoxia and tachypnea. Spoke with Dr. Carren Rang with hospitalist service who agrees to assume care of patient and bring into the hospital for further evaluation and management.    Ova Meegan was evaluated in Emergency Department on 01/02/2020 for the symptoms described in the history of present illness. He was evaluated in the context of the global COVID-19  pandemic, which necessitated consideration that the patient might be at risk for infection with the SARS-CoV-2 virus that causes COVID-19. Institutional protocols and algorithms that pertain to the evaluation of patients at risk for COVID-19 are in a state of rapid change based on information released by regulatory bodies including the CDC and federal and state organizations. These policies and algorithms were followed during the patient's care in the ED.   Portions of this note were generated with Scientist, clinical (histocompatibility and immunogenetics). Dictation errors may occur despite best attempts at proofreading.  Final Clinical Impression(s) / ED Diagnoses Final diagnoses:  Pneumonia due to COVID-19 virus    Rx / DC Orders ED Discharge Orders    None       Kathyrn Lass 01/02/20 2208    Vanetta Mulders, MD 01/02/20 2211

## 2020-01-02 NOTE — H&P (Signed)
TRH H&P    Patient Demographics:    Evan [name], is a 44 y.o. male  MRN: [id number]  DOB - 01/26/1976  Admit Date - 01/02/2020  Referring MD/NP/PA: Deretha Emory  Outpatient Primary MD for the patient is Patient, No Pcp Per  Patient coming from: Home  Chief complaint- Dyspnea   HPI:    Evan [name]  is a 44 y.o. male, with history of obesity presents to the ED with a chief complaint of dyspnea. Patient reports that it started about 7 days ago. He tested positive for covid 2 days ago. He also had a CXR at the urgent care that showed pneumonia at that time. EDP has put a pic of the xray in the chart. Patient reports that his dyspnea is worse with exertion and better with rest. He complains of a cough that was productive of yellow sputum in the beginning, but has since dried up. The cough is worse with exertion as well and better with rest. The cough is associated with a  Burning center chest pain. The pain is gone when he is not coughing. Patient has had fevers at home, but he is not sure of the Tmax. He has had generalized weakness and body aches as well. He admits to decreased appetite, but no diarrhea, nausea or vomiting. Patient does not smoke. He drinks a few times per year. He does not use illicit drugs. Patient is Full code. He has no other complaints/concerns at this time.   In the ED  T 101.6, HR 88, R 20, BP 120/61 No leukocytosis with a WBC 6.4, Hgb 12.3 Chem panel shows elevated Cr at 1.26 - patient's baseline.  LDH 381, Triglycerides 86, Lactic acid 1.0, D-Dimer 1.10, Fibrinogen 628, blood cultures pending Remdesivir, Tylenol EKG HR 91 SR, QTc 437    Review of systems:    In addition to the HPI above,  Positive for Fever-chills, No Headache, No changes with Vision or hearing, No problems swallowing food or Liquids, Positive for Chest pain, Cough and Shortness of Breath, No Abdominal pain, No Nausea  or Vomiting, bowel movements are regular, admits to decreased appetite No Blood in stool or Urine, No dysuria, No new skin rashes or bruises, No new joints pains-aches,  Generalized weakness, but no tingling, numbness in any extremity, No recent weight gain or loss, No polyuria, polydypsia or polyphagia, No significant Mental Stressors.  All other systems reviewed and are negative.    Past History of the following :    History reviewed. No pertinent past medical history.        Social History:      Social History   Tobacco Use  . Smoking status: Not on file  Substance Use Topics  . Alcohol use: Not on file       Family History :    History reviewed. No pertinent family history. HTN   Home Medications:   Prior to Admission medications   Not on File     Allergies:     Allergies  Allergen Reactions  . Penicillins  States he is allergic to liquid penicillins     Physical Exam:   Vitals  Blood pressure (!) 123/59, pulse 87, temperature 100.2 F (37.9 C), resp. rate (!) 28, height 6' (1.829 m), weight 133.8 kg, SpO2 93 %.  1.  General: Laying supine in bed watching television  2. Psychiatric: Mood and behavior are normal for situation  3. Neurologic: CN II-XII intact. Moves all 4 extremities voluntarily AO x3 No focal deficits on limited exam  4. HEENMT:  Head is atraumatic, normocephalic, mucous membranes moist, neck supple, trachea midline  5. Respiratory : Diminished breath sounds in the lower lung fields BL No wheeze, rhonchi, or crackles  6. Cardiovascular : HR normal, rhythm regular, no murmur, rub or gallop  7. Gastrointestinal:  Abdomen is obese, soft, nondistended, non tender to palpation  8. Skin:  No acute lesions on limited skin exam  9.Musculoskeletal:  Trace peripheral edema, no acute deformity    Data Review:    CBC Recent Labs  Lab 01/02/20 2040  WBC 6.4  HGB 12.3*  HCT 37.6*  PLT 245  MCV 87.6  MCH  28.7  MCHC 32.7  RDW 13.7  LYMPHSABS 1.0  MONOABS 0.4  EOSABS 0.0  BASOSABS 0.0   ------------------------------------------------------------------------------------------------------------------  Results for orders placed or performed during the hospital encounter of 01/02/20 (from the past 48 hour(s))  Lactic acid, plasma     Status: None   Collection Time: 01/02/20  8:40 PM  Result Value Ref Range   Lactic Acid, Venous 1.0 0.5 - 1.9 mmol/L    Comment: Performed at [facility], [address] [place], [place] [address]  CBC WITH DIFFERENTIAL     Status: Abnormal   Collection Time: 01/02/20  8:40 PM  Result Value Ref Range   WBC 6.4 4.0 - 10.5 K/uL   RBC 4.29 4.22 - 5.81 MIL/uL   Hemoglobin 12.3 (L) 13.0 - 17.0 g/dL   HCT 16.9 (L) 39 - 52 %   MCV 87.6 80.0 - 100.0 fL   MCH 28.7 26.0 - 34.0 pg   MCHC 32.7 30.0 - 36.0 g/dL   RDW 67.8 93.8 - 10.1 %   Platelets 245 150 - 400 K/uL   nRBC 0.0 0.0 - 0.2 %   Neutrophils Relative % 78 %   Neutro Abs 5.0 1.7 - 7.7 K/uL   Lymphocytes Relative 15 %   Lymphs Abs 1.0 0.7 - 4.0 K/uL   Monocytes Relative 7 %   Monocytes Absolute 0.4 0.1 - 1.0 K/uL   Eosinophils Relative 0 %   Eosinophils Absolute 0.0 0.0 - 0.5 K/uL   Basophils Relative 0 %   Basophils Absolute 0.0 0.0 - 0.1 K/uL   Immature Granulocytes 0 %   Abs Immature Granulocytes 0.02 0.00 - 0.07 K/uL    Comment: Performed at [facility], [address], [place], [place] [address]  Comprehensive metabolic panel     Status: Abnormal   Collection Time: 01/02/20  8:40 PM  Result Value Ref Range   Sodium 136 135 - 145 mmol/L   Potassium 3.9 3.5 - 5.1 mmol/L   Chloride 101 98 - 111 mmol/L   CO2 22 22 - 32 mmol/L   Glucose, Bld 96 70 - 99 mg/dL    Comment: Glucose reference range applies only to samples taken after fasting for at least 8 hours.   BUN 12 6 - 20 mg/dL   Creatinine, Ser 5.85 (H) 0.61 - 1.24 mg/dL  Calcium 8.4 (L) 8.9 - 10.3  mg/dL   Total Protein 7.8 6.5 - 8.1 g/dL   Albumin 3.8 3.5 - 5.0 g/dL   AST 56 (H) 15 - 41 U/L   ALT 31 0 - 44 U/L   Alkaline Phosphatase 31 (L) 38 - 126 U/L   Total Bilirubin 1.6 (H) 0.3 - 1.2 mg/dL   GFR, Estimated >10 >93 mL/min    Comment: (NOTE) Calculated using the CKD-EPI Creatinine Equation (2021)    Anion gap 13 5 - 15    Comment: Performed at [facility], [address], [place], [place] [address]  D-dimer, quantitative     Status: Abnormal   Collection Time: 01/02/20  8:40 PM  Result Value Ref Range   D-Dimer, Quant 1.10 (H) 0.00 - 0.50 ug/mL-FEU    Comment: (NOTE) At the manufacturer cut-off value of 0.5 g/mL FEU, this assay has a negative predictive value of 95-100%.This assay is intended for use in conjunction with a clinical pretest probability (PTP) assessment model to exclude pulmonary embolism (PE) and deep venous thrombosis (DVT) in outpatients suspected of PE or DVT. Results should be correlated with clinical presentation. Performed at [facility], [address], [place], [place] [address]   Lactate dehydrogenase     Status: Abnormal   Collection Time: 01/02/20  8:40 PM  Result Value Ref Range   LDH 381 (H) 98 - 192 U/L    Comment: Performed at [facility], [address], [place], [place] [address]  Ferritin     Status: Abnormal   Collection Time: 01/02/20  8:40 PM  Result Value Ref Range   Ferritin 1,484 (H) 24 - 336 ng/mL    Comment: Performed at [facility], [address], [place], [place] [address]  Triglycerides     Status: None   Collection Time: 01/02/20  8:40 PM  Result Value Ref Range   Triglycerides 86 <150 mg/dL    Comment: Performed at [facility], [address], [place], [place] [address]  Fibrinogen     Status: Abnormal   Collection Time: 01/02/20  8:40 PM  Result Value Ref Range   Fibrinogen 628 (H) 210 - 475 mg/dL    Comment:  Performed at [facility], [address], [place], [place] [address]  C-reactive protein     Status: Abnormal   Collection Time: 01/02/20  8:40 PM  Result Value Ref Range   CRP 21.3 (H) <1.0 mg/dL    Comment: Performed at [facility], [address] [place], [place] [address]    Chemistries  Recent Labs  Lab 01/02/20 2040  NA 136  K 3.9  CL 101  CO2 22  GLUCOSE 96  BUN 12  CREATININE 1.26*  CALCIUM 8.4*  AST 56*  ALT 31  ALKPHOS 31*  BILITOT 1.6*   ------------------------------------------------------------------------------------------------------------------  ------------------------------------------------------------------------------------------------------------------ GFR: Estimated Creatinine Clearance: 108.1 mL/min (A) (by C-G formula based on SCr of 1.26 mg/dL (H)). Liver Function Tests: Recent Labs  Lab 01/02/20 2040  AST 56*  ALT 31  ALKPHOS 31*  BILITOT 1.6*  PROT 7.8  ALBUMIN 3.8   No results for input(s): LIPASE, AMYLASE in the last 168 hours. No results for input(s): AMMONIA in the last 168 hours. Coagulation Profile: No results for input(s): INR, PROTIME in the last 168 hours. Cardiac Enzymes: No results for input(s): CKTOTAL, CKMB, CKMBINDEX, TROPONINI in the last 168 hours. BNP (last 3 results) No  results for input(s): PROBNP in the last 8760 hours. HbA1C: No results for input(s): HGBA1C in the last 72 hours. CBG: No results for input(s): GLUCAP in the last 168 hours. Lipid Profile: Recent Labs    01/02/20 2040  TRIG 86   Thyroid Function Tests: No results for input(s): TSH, T4TOTAL, FREET4, T3FREE, THYROIDAB in the last 72 hours. Anemia Panel: Recent Labs    01/02/20 2040  FERRITIN 1,484*    --------------------------------------------------------------------------------------------------------------- Urine analysis: No results found for: COLORURINE, APPEARANCEUR, LABSPEC, PHURINE,  GLUCOSEU, HGBUR, BILIRUBINUR, KETONESUR, PROTEINUR, UROBILINOGEN, NITRITE, LEUKOCYTESUR    Imaging Results:    DG Chest Portable 1 View  Result Date: 01/02/2020 CLINICAL DATA:  Recent COVID diagnosis. EXAM: PORTABLE CHEST 1 VIEW COMPARISON:  None. FINDINGS: Decreased lung volumes are seen which is likely, in part, secondary to the degree of patient inspiration. Very mild, ill-defined infiltrates are seen within the bilateral lung bases. There is no evidence of a pleural effusion or pneumothorax. The heart size and mediastinal contours are within normal limits. The visualized skeletal structures are unremarkable. IMPRESSION: Very mild, ill-defined bibasilar infiltrates. Electronically Signed   By: Aram Candelahaddeus  Houston M.D.   On: 01/02/2020 22:26    My personal review of EKG: Rhythm NSR, Rate 91 /min, QTc 437 ,no Acute ST changes   Assessment & Plan:    Active Problems:   Pneumonia due to COVID-19 virus   1. Acute hypoxic respiratory failure 1. 2/2 covid pneumonia 2. O2 drops to mid 80s with minimal exertion, and high 80s at rest 3. Requiring 2 L [place] to maintain normal sats, wean off as tolerated 4. Monitor on tele 5. Continue to monitor 2. Covid 19 pneumonia 1. Unvaccinated 2. Symptomatic for 7 days; dyspnea and fever 3. Positive for covid 2 days ago 4. Started on remdesivir and decadron 5. Albuterol inhaler as needed 6. Trend inflammatory markers 7. Continue to monitor 3. Obesity 1. Counseled on the importance of weight loss    DVT Prophylaxis-   Heparin - SCDs   AM Labs Ordered, also please review Full Orders  Family Communication: No family at bedside Code Status:  Full  Admission status: Inpatient :The appropriate admission status for this patient is INPATIENT. Inpatient status is judged to be reasonable and necessary in order to provide the required intensity of service to ensure the patient's safety. The patient's presenting symptoms, physical exam findings, and initial  radiographic and laboratory data in the context of their chronic comorbidities is felt to place them at high risk for further clinical deterioration. Furthermore, it is not anticipated that the patient will be medically stable for discharge from the hospital within 2 midnights of admission. The following factors support the admission status of inpatient.     The patient's presenting symptoms include Dyspnea The worrisome physical exam findings include hypoxia The initial radiographic and laboratory data are worrisome because of Outpatient Xray = pneumonia, out xray is pending The chronic co-morbidities include obesity       * I certify that at the point of admission it is my clinical judgment that the patient will require inpatient hospital care spanning beyond 2 midnights from the point of admission due to high intensity of service, high risk for further deterioration and high frequency of surveillance required.*  Time spent in minutes : 64   Noor Vidales B Zierle-Ghosh DO

## 2020-01-02 NOTE — ED Triage Notes (Signed)
Per EMS, patient from UC. Dx with Covid on 10/28 and pneumonia at [facility] today. O2 saturation 84% while ambulating.

## 2020-01-03 ENCOUNTER — Encounter (HOSPITAL_COMMUNITY): Payer: Self-pay | Admitting: Family Medicine

## 2020-01-03 DIAGNOSIS — E669 Obesity, unspecified: Secondary | ICD-10-CM

## 2020-01-03 DIAGNOSIS — J9601 Acute respiratory failure with hypoxia: Secondary | ICD-10-CM

## 2020-01-03 DIAGNOSIS — N182 Chronic kidney disease, stage 2 (mild): Secondary | ICD-10-CM

## 2020-01-03 DIAGNOSIS — E66812 Obesity, class 2: Secondary | ICD-10-CM

## 2020-01-03 LAB — COMPREHENSIVE METABOLIC PANEL
ALT: 28 U/L (ref 0–44)
AST: 52 U/L — ABNORMAL HIGH (ref 15–41)
Albumin: 3.6 g/dL (ref 3.5–5.0)
Alkaline Phosphatase: 29 U/L — ABNORMAL LOW (ref 38–126)
Anion gap: 10 (ref 5–15)
BUN: 12 mg/dL (ref 6–20)
CO2: 23 mmol/L (ref 22–32)
Calcium: 8.4 mg/dL — ABNORMAL LOW (ref 8.9–10.3)
Chloride: 102 mmol/L (ref 98–111)
Creatinine, Ser: 1.23 mg/dL (ref 0.61–1.24)
GFR, Estimated: >60 mL/min (ref >60)
Glucose, Bld: 112 mg/dL — ABNORMAL HIGH (ref 70–99)
Potassium: 4 mmol/L (ref 3.5–5.1)
Sodium: 135 mmol/L (ref 135–145)
Total Bilirubin: 1.5 mg/dL — ABNORMAL HIGH (ref 0.3–1.2)
Total Protein: 7.6 g/dL (ref 6.5–8.1)

## 2020-01-03 LAB — CBC WITH DIFFERENTIAL/PLATELET
Abs Immature Granulocytes: 0.03 10*3/uL (ref 0.00–0.07)
Basophils Absolute: 0 10*3/uL (ref 0.0–0.1)
Basophils Relative: 0 %
Eosinophils Absolute: 0 10*3/uL (ref 0.0–0.5)
Eosinophils Relative: 0 %
HCT: 37 % — ABNORMAL LOW (ref 39.0–52.0)
Hemoglobin: 12 g/dL — ABNORMAL LOW (ref 13.0–17.0)
Immature Granulocytes: 1 %
Lymphocytes Relative: 9 %
Lymphs Abs: 0.6 10*3/uL — ABNORMAL LOW (ref 0.7–4.0)
MCH: 28.5 pg (ref 26.0–34.0)
MCHC: 32.4 g/dL (ref 30.0–36.0)
MCV: 87.9 fL (ref 80.0–100.0)
Monocytes Absolute: 0.4 10*3/uL (ref 0.1–1.0)
Monocytes Relative: 7 %
Neutro Abs: 5.4 10*3/uL (ref 1.7–7.7)
Neutrophils Relative %: 83 %
Platelets: 241 10*3/uL (ref 150–400)
RBC: 4.21 MIL/uL — ABNORMAL LOW (ref 4.22–5.81)
RDW: 13.6 % (ref 11.5–15.5)
WBC: 6.5 10*3/uL (ref 4.0–10.5)
nRBC: 0 % (ref 0.0–0.2)

## 2020-01-03 LAB — C-REACTIVE PROTEIN: CRP: 21.9 mg/dL — ABNORMAL HIGH (ref <1.0)

## 2020-01-03 LAB — FERRITIN: Ferritin: 1453 ng/mL — ABNORMAL HIGH (ref 24–336)

## 2020-01-03 LAB — D-DIMER, QUANTITATIVE: D-Dimer, Quant: 1.07 ug/mL-FEU — ABNORMAL HIGH (ref 0.00–0.50)

## 2020-01-03 LAB — HIV ANTIBODY (ROUTINE TESTING W REFLEX): HIV Screen 4th Generation wRfx: NONREACTIVE

## 2020-01-03 LAB — MAGNESIUM: Magnesium: 2.3 mg/dL (ref 1.7–2.4)

## 2020-01-03 LAB — LACTIC ACID, PLASMA: Lactic Acid, Venous: 1 mmol/L (ref 0.5–1.9)

## 2020-01-03 LAB — PROCALCITONIN: Procalcitonin: <0.1 ng/mL

## 2020-01-03 MED ORDER — BARICITINIB 2 MG PO TABS
4.0000 mg | ORAL_TABLET | Freq: Every day | ORAL | Status: DC
  Administered 2020-01-03 – 2020-01-06 (×4): 4 mg via ORAL
  Filled 2020-01-03 (×4): qty 2

## 2020-01-03 MED ORDER — ENOXAPARIN SODIUM 80 MG/0.8ML ~~LOC~~ SOLN
70.0000 mg | SUBCUTANEOUS | Status: DC
  Administered 2020-01-03 – 2020-01-05 (×3): 70 mg via SUBCUTANEOUS
  Filled 2020-01-03 (×3): qty 0.8

## 2020-01-03 MED ORDER — METHYLPREDNISOLONE SODIUM SUCC 125 MG IJ SOLR
60.0000 mg | Freq: Two times a day (BID) | INTRAMUSCULAR | Status: DC
  Administered 2020-01-03 – 2020-01-06 (×7): 60 mg via INTRAVENOUS
  Filled 2020-01-03 (×6): qty 2

## 2020-01-03 MED ORDER — ALBUTEROL SULFATE HFA 108 (90 BASE) MCG/ACT IN AERS: 1.0000 | INHALATION_SPRAY | RESPIRATORY_TRACT | Status: DC | PRN

## 2020-01-03 MED ORDER — METHYLPREDNISOLONE SODIUM SUCC 125 MG IJ SOLR: 60.0000 mg | Freq: Two times a day (BID) | INTRAMUSCULAR | Status: DC

## 2020-01-03 MED ORDER — HYDROCOD POLST-CPM POLST ER 10-8 MG/5ML PO SUER
5.0000 mL | Freq: Two times a day (BID) | ORAL | Status: DC
  Administered 2020-01-04: 5 mL via ORAL
  Filled 2020-01-03 (×2): qty 5

## 2020-01-03 NOTE — Plan of Care (Signed)

## 2020-01-03 NOTE — Progress Notes (Signed)
Patient arrived to the unit, made aware of all the interventions and teachings to assist with improving outcome. Patient demonstrated use of incentive spirometer. Was placed on 2L [place] for o2 comfort, patient stated he felt breathing was more at ease with o2 on. Vitals continue to be monitored, continues with low grade fevers. Will continue to monitor.

## 2020-01-03 NOTE — Progress Notes (Addendum)
PROGRESS NOTE    Blayne Frankie  WUJ:[id number] DOB: June 04, 1975 DOA: 01/02/2020 PCP: Patient, No Pcp Per    Brief Narrative:  Mr. Fells was admitted to the hospital with a working diagnosis of acute hypoxic respiratory failure due to SARS COVID-19 viral pneumonia.  44 year old male with past medical history of obesity who presents with dyspnea.  Patient tested positive for COVID-19 10/27.  He was diagnosed with viral pneumonia as an outpatient at urgent care.  Despite symptomatic treatment he continued to deteriorate, with worsening dyspnea, productive cough, fever, generalized weakness, malaise, decreased p.o. intake and burning chest pain.  On his initial physical examination he was febrile 101.6 F, heart rate 88, respiratory rate 20, blood pressure 120/61.  His lungs had decreased breath sounds bilaterally, no wheezing, heart S1-S2, present and rhythmic, soft abdomen, no lower extremity edema. Sodium 136, potassium 3.9, chloride 101, bicarb 22, glucose 96, BUN 12, creatinine 1.26, [place] count 6.4, hemoglobin 12.3, hematocrit 37.6, platelets 245. SARS COVID-19 positive.  Chest radiograph with bilateral lower lobe interstitial infiltrates, left upper lobe faint interstitial infiltrate. EKG 91 bpm, normal axis, intervals, sinus rhythm, Q-wave lead I-aVL, lead II-aVF, V4 through V6, no ST segment or significant T wave changes.    Assessment & Plan:   Principal Problem:   Pneumonia due to COVID-19 virus Active Problems:   Acute respiratory failure with hypoxia (HCC)   Class 2 obesity   CKD stage G2/A2, GFR 60-89 and albumin creatinine ratio 30-299 mg/g   1.  Acute hypoxic respiratory failure due to SARS COVID-19 viral pneumonia.  RR: 20  Pulse oxymetry: 97%  Fi02:  2 L/ min per [place]    COVID-19 Labs  Recent Labs    01/02/20 2040 01/03/20 0340  DDIMER 1.10* 1.07*  FERRITIN 1,484* 1,453*  LDH 381*  --   CRP 21.3* 21.9*    No results found for: SARSCOV2NAA   Patient with  very high inflammatory markers, but continue with low oxygen requirements.   In the setting of high risk of worsening hypoxic respiratory failure, I explained the patient in detail, the use of advanced therapy baricitinib. I informed him the risks of this interventions including immunosuppression, opportunistic infections, allergic reactions, and others.  I explained him the experimental nature of this interventions, in the face of current COVID 19 pandemic, along with the potential benefits of improvement of hypoxic respiratory failure and systemic inflammation.  he has agreed to proceed.  Start patient on baricitinib per protocol. Continue with systemic corticosteroids with methylprednisolone 60 mg IV q12 and antiviral therapy with Remdesivir.  Antitussive agents, bronchodilator therapy and airway clearing techniques with flutter valve and incentive spirometer.  Out of bed to chair tid with meals.  Continue to follow oxymetry and inflammatory markers, keep oxygen saturation more than 88%.   2. Obesity class 2. Calculated BMI is 40, will need outpatient follow up.   3. Chronic anemia. Hgn is 12.0 with Hct 37,0. No indication for PRBC transfusion, continue work up as outpatient.   4. CKD stage 2. Serum cr at 1,2, K is 4,0 and bicarbonate at 23. Close follow up on renal function and electrolytes.   Patient continue to be at high risk for worsening respiratory failure   Status is: Inpatient  Remains inpatient appropriate because:IV treatments appropriate due to intensity of illness or inability to take PO   Dispo: The patient is from: Home              Anticipated d/c is to: Home  Anticipated d/c date is: > 3 days              Patient currently is not medically stable to d/c.    DVT prophylaxis: Enoxaparin   Code Status:   full  Family Communication:  Patient communicating with his family using his phone.     Subjective: Dyspnea has been improving, but not yet back to  baseline, no nausea or vomiting and tolerating po well.   Objective: Vitals:   01/03/20 0343 01/03/20 0503 01/03/20 0525 01/03/20 0900  BP:  95/64 117/72 116/77  Pulse:  85 81 83  Resp:  (!) 23 (!) 21 20  Temp: 99.7 F (37.6 C) 100 F (37.8 C) 100.1 F (37.8 C)   TempSrc: Oral Oral Oral   SpO2:  93% 95% 97%  Weight:      Height:       No intake or output data in the 24 hours ending 01/03/20 1126 Filed Weights   01/02/20 1758  Weight: 133.8 kg    Examination:   General: Not in pain or dyspnea. Deconditioned  Neurology: Awake and alert, non focal  E ENT: no pallor, no icterus, oral mucosa moist Cardiovascular: No JVD. S1-S2 present, rhythmic, no gallops, rubs, or murmurs. No lower extremity edema. Pulmonary: positive breath sounds bilaterally, no wheezing. Gastrointestinal. Abdomen soft and non tender Skin. No rashes Musculoskeletal: no joint deformities     Data Reviewed: I have personally reviewed following labs and imaging studies  CBC: Recent Labs  Lab 01/02/20 2040 01/03/20 0340  WBC 6.4 6.5  NEUTROABS 5.0 5.4  HGB 12.3* 12.0*  HCT 37.6* 37.0*  MCV 87.6 87.9  PLT 245 241   Basic Metabolic Panel: Recent Labs  Lab 01/02/20 2040 01/03/20 0340  NA 136 135  K 3.9 4.0  CL 101 102  CO2 22 23  GLUCOSE 96 112*  BUN 12 12  CREATININE 1.26* 1.23  CALCIUM 8.4* 8.4*  MG  --  2.3   GFR: Estimated Creatinine Clearance: 110.8 mL/min (by C-G formula based on SCr of 1.23 mg/dL). Liver Function Tests: Recent Labs  Lab 01/02/20 2040 01/03/20 0340  AST 56* 52*  ALT 31 28  ALKPHOS 31* 29*  BILITOT 1.6* 1.5*  PROT 7.8 7.6  ALBUMIN 3.8 3.6   No results for input(s): LIPASE, AMYLASE in the last 168 hours. No results for input(s): AMMONIA in the last 168 hours. Coagulation Profile: No results for input(s): INR, PROTIME in the last 168 hours. Cardiac Enzymes: No results for input(s): CKTOTAL, CKMB, CKMBINDEX, TROPONINI in the last 168 hours. BNP (last 3  results) No results for input(s): PROBNP in the last 8760 hours. HbA1C: No results for input(s): HGBA1C in the last 72 hours. CBG: No results for input(s): GLUCAP in the last 168 hours. Lipid Profile: Recent Labs    01/02/20 2040  TRIG 86   Thyroid Function Tests: No results for input(s): TSH, T4TOTAL, FREET4, T3FREE, THYROIDAB in the last 72 hours. Anemia Panel: Recent Labs    01/02/20 2040 01/03/20 0340  FERRITIN 1,484* 1,453*      Radiology Studies: I have reviewed all of the imaging during this hospital visit personally     Scheduled Meds: . vitamin C  500 mg Oral Daily  . dexamethasone (DECADRON) injection  6 mg Intravenous Q24H  . heparin  5,000 Units Subcutaneous Q8H  . Ipratropium-Albuterol  1 puff Inhalation Q6H  . zinc sulfate  220 mg Oral Daily   Continuous Infusions: . remdesivir 100  mg in NS 100 mL       LOS: 1 day        Khaleesi Gruel Annett Gula, MD

## 2020-01-04 DIAGNOSIS — J9601 Acute respiratory failure with hypoxia: Secondary | ICD-10-CM

## 2020-01-04 LAB — D-DIMER, QUANTITATIVE: D-Dimer, Quant: 1.1 ug/mL-FEU — ABNORMAL HIGH (ref 0.00–0.50)

## 2020-01-04 LAB — COMPREHENSIVE METABOLIC PANEL
ALT: 31 U/L (ref 0–44)
AST: 43 U/L — ABNORMAL HIGH (ref 15–41)
Albumin: 3.3 g/dL — ABNORMAL LOW (ref 3.5–5.0)
Alkaline Phosphatase: 34 U/L — ABNORMAL LOW (ref 38–126)
Anion gap: 9 (ref 5–15)
BUN: 21 mg/dL — ABNORMAL HIGH (ref 6–20)
CO2: 23 mmol/L (ref 22–32)
Calcium: 8.3 mg/dL — ABNORMAL LOW (ref 8.9–10.3)
Chloride: 103 mmol/L (ref 98–111)
Creatinine, Ser: 1.14 mg/dL (ref 0.61–1.24)
GFR, Estimated: >60 mL/min (ref >60)
Glucose, Bld: 160 mg/dL — ABNORMAL HIGH (ref 70–99)
Potassium: 3.8 mmol/L (ref 3.5–5.1)
Sodium: 135 mmol/L (ref 135–145)
Total Bilirubin: 1.3 mg/dL — ABNORMAL HIGH (ref 0.3–1.2)
Total Protein: 7.5 g/dL (ref 6.5–8.1)

## 2020-01-04 LAB — C-REACTIVE PROTEIN: CRP: 16.8 mg/dL — ABNORMAL HIGH (ref <1.0)

## 2020-01-04 LAB — FERRITIN: Ferritin: 1460 ng/mL — ABNORMAL HIGH (ref 24–336)

## 2020-01-04 NOTE — Progress Notes (Signed)
Patient ID: Evan [name], male   DOB: 03/26/1975, 44 y.o.   MRN: [id number]  PROGRESS NOTE    Chesley Veasey  JSR:[id number] DOB: 04/15/1975 DOA: 01/02/2020 PCP: Patient, No Pcp Per    Brief Narrative:  Mr. Stucky was admitted to the hospital with a working diagnosis of acute hypoxic respiratory failure due to SARS COVID-19 viral pneumonia.  44 year old male with past medical history of obesity who presents with dyspnea.  Patient tested positive for COVID-19 10/27.  He was diagnosed with viral pneumonia as an outpatient at urgent care.  Despite symptomatic treatment he continued to deteriorate, with worsening dyspnea, productive cough, fever, generalized weakness, malaise, decreased p.o. intake and burning chest pain.  On his initial physical examination he was febrile 101.6 F, heart rate 88, respiratory rate 20, blood pressure 120/61.  SARS COVID-19 positive.  Chest radiograph with bilateral infiltrates   Assessment & Plan:   Principal Problem:   Pneumonia due to COVID-19 virus Active Problems:   Acute respiratory failure with hypoxia (HCC)   Class 2 obesity   CKD stage G2/A2, GFR 60-89 and albumin creatinine ratio 30-299 mg/g  1.  Acute hypoxic respiratory failure due to SARS COVID-19 viral pneumonia.  RR: 21 Pulse oxymetry: 92%, room air  Worsening inflammatory markers precipitated varsity neb to be used.   Inflammatory markers continue to be elevated today though he just got started yesterday.   Continue with systemic corticosteroids with methylprednisolone 60 mg IV q12 and antiviral therapy with Remdesivir.  Antitussive agents, bronchodilator therapy and airway clearing techniques with flutter valve and incentive spirometer.  Out of bed to chair tid with meals.  Oxygen as needed to keep oxygen saturation more than 88%.   2. Obesity class 2. Calculated BMI is 40, will need outpatient follow up.   3. Chronic anemia. Hgn is 12.0  Trend  4. CKD stage 2. Serum cr at  1.14 today Trend   DVT prophylaxis: Lovenox SQ Code Status: Full code  Family Communication: Patient at bedside Disposition Plan: Home  Patient remains inpatient due to ongoing oxygen requirements, IV medications.  Consultants:   None  Procedures:  None  Antimicrobials: Anti-infectives (From admission, onward)   Start     Dose/Rate Route Frequency Ordered Stop   01/03/20 1600  remdesivir 100 mg in sodium chloride 0.9 % 100 mL IVPB       "Followed by" Linked Group Details   100 mg 200 mL/hr over 30 Minutes Intravenous Daily 01/02/20 2006 01/07/20 0959   01/02/20 2100  remdesivir 200 mg in sodium chloride 0.9% 250 mL IVPB       "Followed by" Linked Group Details   200 mg 580 mL/hr over 30 Minutes Intravenous Once 01/02/20 2006 01/02/20 2135       Subjective: Reports minimal improvement today.  Objective: Vitals:   01/04/20 0344 01/04/20 0431 01/04/20 0530 01/04/20 1324  BP:  108/61  (!) 146/134  Pulse: 74 80 76 77  Resp:  (!) 21  18  Temp:  98.2 F (36.8 C)  (!) 97.5 F (36.4 C)  TempSrc:  Oral    SpO2: 92% 92% 93% 95%  Weight:      Height:        Intake/Output Summary (Last 24 hours) at 01/04/2020 1618 Last data filed at 01/04/2020 1001 Gross per 24 hour  Intake 680 ml  Output --  Net 680 ml   Filed Weights   01/02/20 1758  Weight: 133.8 kg    Examination:  General exam:  Appears calm and comfortable  Respiratory system: Clear to auscultation. Respiratory effort normal. Cardiovascular system: S1 & S2 heard, RRR.  Gastrointestinal system: Abdomen is nondistended, soft and nontender.  Central nervous system: Alert and oriented. No focal neurological deficits. Extremities: Symmetric  Skin: No rashes Psychiatry: Judgement and insight appear normal. Mood & affect appropriate.     Data Reviewed: I have personally reviewed following labs and imaging studies  CBC: Recent Labs  Lab 01/02/20 2040 01/03/20 0340  WBC 6.4 6.5  NEUTROABS 5.0 5.4   HGB 12.3* 12.0*  HCT 37.6* 37.0*  MCV 87.6 87.9  PLT 245 241   Basic Metabolic Panel: Recent Labs  Lab 01/02/20 2040 01/03/20 0340 01/04/20 0532  NA 136 135 135  K 3.9 4.0 3.8  CL 101 102 103  CO2 22 23 23   GLUCOSE 96 112* 160*  BUN 12 12 21*  CREATININE 1.26* 1.23 1.14  CALCIUM 8.4* 8.4* 8.3*  MG  --  2.3  --    GFR: Estimated Creatinine Clearance: 119.5 mL/min (by C-G formula based on SCr of 1.14 mg/dL). Liver Function Tests: Recent Labs  Lab 01/02/20 2040 01/03/20 0340 01/04/20 0532  AST 56* 52* 43*  ALT 31 28 31   ALKPHOS 31* 29* 34*  BILITOT 1.6* 1.5* 1.3*  PROT 7.8 7.6 7.5  ALBUMIN 3.8 3.6 3.3*   Lipid Profile: Recent Labs    01/02/20 2040  TRIG 86   Anemia Panel: Recent Labs    01/03/20 0340 01/04/20 0532  FERRITIN 1,453* 1,460*   Sepsis Labs: Recent Labs  Lab 01/02/20 2040 01/03/20 0340  PROCALCITON <0.10 <0.10  LATICACIDVEN 1.0 1.0    Recent Results (from the past 240 hour(s))  Blood Culture (routine x 2)     Status: None (Preliminary result)   Collection Time: 01/02/20  8:40 PM   Specimen: BLOOD  Result Value Ref Range Status   Specimen Description   Final    BLOOD RIGHT ANTECUBITAL Performed at [facility], [address] [place], [place]    Special Requests   Final    BOTTLES DRAWN AEROBIC AND ANAEROBIC Blood Culture adequate volume Performed at [facility], [address] [place], [place]    Culture   Final    NO GROWTH 1 DAY Performed at [facility], [address] [place], [address] [place]    Report Status PENDING  Incomplete  Blood Culture (routine x 2)     Status: None (Preliminary result)   Collection Time: 01/02/20  8:45 PM   Specimen: BLOOD  Result Value Ref Range Status   Specimen Description   Final    BLOOD LEFT ANTECUBITAL Performed at [facility], [address] [place], [place]    Special Requests   Final     BOTTLES DRAWN AEROBIC AND ANAEROBIC Blood Culture adequate volume Performed at [facility], [address], [place], [place]    Culture   Final    NO GROWTH 1 DAY Performed at [facility], [address] [place], [address] [place]    Report Status PENDING  Incomplete      Radiology Studies: DG Chest Portable 1 View  Result Date: 01/02/2020 CLINICAL DATA:  Recent COVID diagnosis. EXAM: PORTABLE CHEST 1 VIEW COMPARISON:  None. FINDINGS: Decreased lung volumes are seen which is likely, in part, secondary to the degree of patient inspiration. Very mild, ill-defined infiltrates are seen within the  bilateral lung bases. There is no evidence of a pleural effusion or pneumothorax. The heart size and mediastinal contours are within normal limits. The visualized skeletal structures are unremarkable. IMPRESSION: Very mild, ill-defined bibasilar infiltrates. Electronically Signed   By: Aram Candela M.D.   On: 01/02/2020 22:26     Scheduled Meds: . vitamin C  500 mg Oral Daily  . baricitinib  4 mg Oral Daily  . chlorpheniramine-HYDROcodone  5 mL Oral Q12H  . enoxaparin (LOVENOX) injection  70 mg Subcutaneous Q24H  . Ipratropium-Albuterol  1 puff Inhalation Q6H  . methylPREDNISolone (SOLU-MEDROL) injection  60 mg Intravenous BID  . zinc sulfate  220 mg Oral Daily   Continuous Infusions: . remdesivir 100 mg in NS 100 mL 100 mg (01/04/20 1040)     LOS: 2 days    Reva Bores, MD 01/04/2020 4:18 PM [phone] [facility] If 7PM-7AM, please contact night-coverage 01/04/2020, 4:18 PM

## 2020-01-05 DIAGNOSIS — E669 Obesity, unspecified: Secondary | ICD-10-CM

## 2020-01-05 LAB — COMPREHENSIVE METABOLIC PANEL
ALT: 31 U/L (ref 0–44)
AST: 38 U/L (ref 15–41)
Albumin: 3.5 g/dL (ref 3.5–5.0)
Alkaline Phosphatase: 34 U/L — ABNORMAL LOW (ref 38–126)
Anion gap: 8 (ref 5–15)
BUN: 23 mg/dL — ABNORMAL HIGH (ref 6–20)
CO2: 23 mmol/L (ref 22–32)
Calcium: 8.4 mg/dL — ABNORMAL LOW (ref 8.9–10.3)
Chloride: 105 mmol/L (ref 98–111)
Creatinine, Ser: 1.12 mg/dL (ref 0.61–1.24)
GFR, Estimated: >60 mL/min (ref >60)
Glucose, Bld: 158 mg/dL — ABNORMAL HIGH (ref 70–99)
Potassium: 4.4 mmol/L (ref 3.5–5.1)
Sodium: 136 mmol/L (ref 135–145)
Total Bilirubin: 0.9 mg/dL (ref 0.3–1.2)
Total Protein: 7.1 g/dL (ref 6.5–8.1)

## 2020-01-05 LAB — FERRITIN: Ferritin: 1301 ng/mL — ABNORMAL HIGH (ref 24–336)

## 2020-01-05 LAB — D-DIMER, QUANTITATIVE: D-Dimer, Quant: 0.59 ug/mL-FEU — ABNORMAL HIGH (ref 0.00–0.50)

## 2020-01-05 LAB — C-REACTIVE PROTEIN: CRP: 9.8 mg/dL — ABNORMAL HIGH (ref <1.0)

## 2020-01-05 MED ORDER — POLYETHYLENE GLYCOL 3350 17 G PO PACK
17.0000 g | PACK | Freq: Every day | ORAL | Status: DC
  Administered 2020-01-06: 17 g via ORAL
  Filled 2020-01-05: qty 1

## 2020-01-05 NOTE — Progress Notes (Signed)
PROGRESS NOTE  Tramain Gershman [id number] DOB: [birth year] DOA: 01/02/2020 PCP: Patient, No Pcp Per   LOS: 3 days   Brief narrative: As per HPI,  44 year old male with past medical history of obesity, CKD, CKD stage II present hospital with dyspnea.  Patient had initially presented to a urgent care with worsening dyspnea, productive cough,fever, generalized weakness, malaise,decreased p.o. intake and burning chest pain. On his initial physical examination he was febrile 101.6 F, heart rate 88, respiratory rate 20, blood pressure 120/61. SARS COVID-19 positive.Chest radiograph with bilateral infiltrates.  Patient was then admitted hospital for further evaluation and treatment.  Assessment/Plan:  Principal Problem:   Pneumonia due to COVID-19 virus Active Problems:   Acute respiratory failure with hypoxia (HCC)   Class 2 obesity   CKD stage G2/A2, GFR 60-89 and albumin creatinine ratio 30-299 mg/g  Acute hypoxic respiratory failure due to SARS COVID-19 viral pneumonia. Chest x-ray on 01/02/2020 showed mild ill-defined bibasilar infiltrates.  Patient did have worsening inflammatory biomarkers which have slightly decreased today but is still significantly elevated..  Continue Solu-Medrol, remdesivir, baricitinib. continue supportive care with antitussives bronchodilators.    Morbid obesity.  Will benefit from weight loss.   Chronic anemia.  Hemoglobin of 12.0.  No acute issues.  CKD stage 2.  Stable at this time.  Follow trend.  DVT prophylaxis: SCDs Start: 01/02/20 2239    Code Status: Full code  Family Communication: None  Status is: Inpatient  Remains inpatient appropriate because:IV treatments appropriate due to intensity of illness or inability to take PO and Inpatient level of care appropriate due to severity of illness   Dispo: The patient is from: Home              Anticipated d/c is to: Home              Anticipated d/c date is: 1 day, trend inflammatory  biomarkers.  If trending down, hopefully discharge home in a.m.              Patient currently is not medically stable to d/c.  Consultants:  None  Procedures:  None  Antibiotics:  . Remdesivir  Anti-infectives (From admission, onward)   Start     Dose/Rate Route Frequency Ordered Stop   01/03/20 1600  remdesivir 100 mg in sodium chloride 0.9 % 100 mL IVPB       "Followed by" Linked Group Details   100 mg 200 mL/hr over 30 Minutes Intravenous Daily 01/02/20 2006 01/07/20 0959   01/02/20 2100  remdesivir 200 mg in sodium chloride 0.9% 250 mL IVPB       "Followed by" Linked Group Details   200 mg 580 mL/hr over 30 Minutes Intravenous Once 01/02/20 2006 01/02/20 2135     Subjective: Today, patient was seen and examined at bedside.  Patient denies any shortness of breath, chest pain, palpitation, fever or chills.  Objective: Vitals:   01/04/20 1324 01/05/20 0415  BP: (!) 146/134 119/66  Pulse: 77 74  Resp: 18 16  Temp: (!) 97.5 F (36.4 C) 98.7 F (37.1 C)  SpO2: 95% 95%    Intake/Output Summary (Last 24 hours) at 01/05/2020 0855 Last data filed at 01/05/2020 0600 Gross per 24 hour  Intake 1060 ml  Output --  Net 1060 ml   Filed Weights   01/02/20 1758  Weight: 133.8 kg   Body mass index is 40.01 kg/m.   Physical Exam: GENERAL: Patient is alert awake and oriented. Not in obvious distress.  obese HENT: No scleral pallor or icterus. Pupils equally reactive to light. Oral mucosa is moist NECK: is supple, no gross swelling noted. CHEST: Clear to auscultation. No crackles or wheezes.  Diminished breath sounds bilaterally. CVS: S1 and S2 heard, no murmur. Regular rate and rhythm.  ABDOMEN: Soft, non-tender, bowel sounds are present. EXTREMITIES: No edema. CNS: Cranial nerves are intact. No focal motor deficits. SKIN: warm and dry without rashes.  Data Review: I have personally reviewed the following laboratory data and studies,  CBC: Recent Labs  Lab  01-10-20 2040 01/03/20 0340  WBC 6.4 6.5  NEUTROABS 5.0 5.4  HGB 12.3* 12.0*  HCT 37.6* 37.0*  MCV 87.6 87.9  PLT 245 241   Basic Metabolic Panel: Recent Labs  Lab January 10, 2020 2040 01/03/20 0340 01/04/20 0532 01/05/20 0338  NA 136 135 135 136  K 3.9 4.0 3.8 4.4  CL 101 102 103 105  CO2 22 23 23 23   GLUCOSE 96 112* 160* 158*  BUN 12 12 21* 23*  CREATININE 1.26* 1.23 1.14 1.12  CALCIUM 8.4* 8.4* 8.3* 8.4*  MG  --  2.3  --   --    Liver Function Tests: Recent Labs  Lab 2020-01-10 2040 01/03/20 0340 01/04/20 0532 01/05/20 0338  AST 56* 52* 43* 38  ALT 31 28 31 31   ALKPHOS 31* 29* 34* 34*  BILITOT 1.6* 1.5* 1.3* 0.9  PROT 7.8 7.6 7.5 7.1  ALBUMIN 3.8 3.6 3.3* 3.5   No results for input(s): LIPASE, AMYLASE in the last 168 hours. No results for input(s): AMMONIA in the last 168 hours. Cardiac Enzymes: No results for input(s): CKTOTAL, CKMB, CKMBINDEX, TROPONINI in the last 168 hours. BNP (last 3 results) No results for input(s): BNP in the last 8760 hours.  ProBNP (last 3 results) No results for input(s): PROBNP in the last 8760 hours.  CBG: No results for input(s): GLUCAP in the last 168 hours. Recent Results (from the past 240 hour(s))  Blood Culture (routine x 2)     Status: None (Preliminary result)   Collection Time: January 10, 2020  8:40 PM   Specimen: BLOOD  Result Value Ref Range Status   Specimen Description   Final    BLOOD RIGHT ANTECUBITAL Performed at [facility], [address], [place], [place]    Special Requests   Final    BOTTLES DRAWN AEROBIC AND ANAEROBIC Blood Culture adequate volume Performed at [facility], [name], [place], [place]    Culture   Final    NO GROWTH 1 DAY Performed at [facility], [address], [place], [address] [place]    Report Status PENDING  Incomplete  Blood Culture (routine x 2)     Status: None (Preliminary result)   Collection Time: 01-10-2020   8:45 PM   Specimen: BLOOD  Result Value Ref Range Status   Specimen Description   Final    BLOOD LEFT ANTECUBITAL Performed at [facility], [address] [place], [place]    Special Requests   Final    BOTTLES DRAWN AEROBIC AND ANAEROBIC Blood Culture adequate volume Performed at [facility], [address], [place], [place]    Culture   Final    NO GROWTH 1 DAY Performed at [facility], [address] [place], [address] [place]    Report Status PENDING  Incomplete     Studies: No results found.  Joycelyn Das, MD  [facility] 01/05/2020

## 2020-01-06 LAB — D-DIMER, QUANTITATIVE: D-Dimer, Quant: 0.62 ug/mL-FEU — ABNORMAL HIGH (ref 0.00–0.50)

## 2020-01-06 LAB — C-REACTIVE PROTEIN: CRP: 5.2 mg/dL — ABNORMAL HIGH (ref <1.0)

## 2020-01-06 LAB — COMPREHENSIVE METABOLIC PANEL
ALT: 33 U/L (ref 0–44)
AST: 35 U/L (ref 15–41)
Albumin: 3.4 g/dL — ABNORMAL LOW (ref 3.5–5.0)
Alkaline Phosphatase: 30 U/L — ABNORMAL LOW (ref 38–126)
Anion gap: 9 (ref 5–15)
BUN: 19 mg/dL (ref 6–20)
CO2: 23 mmol/L (ref 22–32)
Calcium: 8.5 mg/dL — ABNORMAL LOW (ref 8.9–10.3)
Chloride: 105 mmol/L (ref 98–111)
Creatinine, Ser: 1.07 mg/dL (ref 0.61–1.24)
GFR, Estimated: >60 mL/min (ref >60)
Glucose, Bld: 142 mg/dL — ABNORMAL HIGH (ref 70–99)
Potassium: 4.6 mmol/L (ref 3.5–5.1)
Sodium: 137 mmol/L (ref 135–145)
Total Bilirubin: 0.9 mg/dL (ref 0.3–1.2)
Total Protein: 7.2 g/dL (ref 6.5–8.1)

## 2020-01-06 LAB — FERRITIN: Ferritin: 981 ng/mL — ABNORMAL HIGH (ref 24–336)

## 2020-01-06 MED ORDER — GUAIFENESIN-DM 100-10 MG/5ML PO SYRP: 10.0000 mL | ORAL_SOLUTION | ORAL | 0 refills | Status: AC | PRN

## 2020-01-06 MED ORDER — PREDNISONE 10 MG PO TABS: 40.0000 mg | ORAL_TABLET | Freq: Every day | ORAL | 0 refills | Status: AC

## 2020-01-06 MED ORDER — ZINC SULFATE 220 (50 ZN) MG PO CAPS: 220.0000 mg | ORAL_CAPSULE | Freq: Every day | ORAL | 0 refills | Status: AC

## 2020-01-06 MED ORDER — PREDNISONE 10 MG PO TABS: 40.0000 mg | ORAL_TABLET | Freq: Every day | ORAL | 0 refills | Status: DC

## 2020-01-06 MED ORDER — ZINC SULFATE 220 (50 ZN) MG PO CAPS: 220.0000 mg | ORAL_CAPSULE | Freq: Every day | ORAL | Status: DC

## 2020-01-06 MED ORDER — GUAIFENESIN-DM 100-10 MG/5ML PO SYRP: 10.0000 mL | ORAL_SOLUTION | ORAL | 0 refills | Status: DC | PRN

## 2020-01-06 MED ORDER — ASCORBIC ACID 500 MG PO TABS: 500.0000 mg | ORAL_TABLET | Freq: Every day | ORAL | Status: AC

## 2020-01-06 NOTE — Plan of Care (Signed)

## 2020-01-06 NOTE — Discharge Summary (Signed)
Physician Discharge Summary  Evan [name] [id number] DOB: 12/29/1975 DOA: 01/02/2020  PCP: Patient, No Pcp Per  Admit date: 01/02/2020 Discharge date: 01/06/2020  Admitted From: Home  Discharge disposition: Home   Recommendations for Outpatient Follow-Up:    Follow up with your primary care provider in one week.   Check CBC, BMP, magnesium in the next visit  Discharge Diagnosis:   Principal Problem:   Pneumonia due to COVID-19 virus Active Problems:   Acute respiratory failure with hypoxia (HCC)   Class 2 obesity   CKD stage G2/A2, GFR 60-89 and albumin creatinine ratio 30-299 mg/g   Discharge Condition: Improved.  Diet recommendation: .  Regular.  Wound care: None.  Code status: Full.   History of Present Illness:   44 year old male with past medical history of obesity, CKD, CKD stage II present hospital with dyspnea.  Patient had initially presented to a urgent care with worsening dyspnea, productive cough,fever, generalized weakness, malaise,decreased p.o. intake and burning chest pain. On his initial physical examination he was febrile 101.6 F, heart rate 88, respiratory rate 20, blood pressure 120/61. SARS COVID-19 positive.Chest radiograph with bilateral infiltrates.  Patient was then admitted hospital for further evaluation and treatment.  Hospital Course:   Following conditions were addressed during hospitalization as listed below,  Acute hypoxic respiratory failure due to SARS COVID-19 viral pneumonia. Chest x-ray on 01/02/2020 showed mild ill-defined bibasilar infiltrates.  Patient did have worsening inflammatory biomarkers which have continued to improve at this time with treatment.  Patient received  Solu-Medrol, remdesivir, baricitinib during hospitalization.  Morbid obesity.  Will benefit from weight loss.   Chronic anemia.  Hemoglobin of 12.0.  No acute issues.  CKD stage 2.  Stable at this time.  Follow trend.  Disposition.  At this  time, patient is stable for disposition home.  Patient was advised on isolation precautions on discharge.  Medical Consultants:    None.  Procedures:    None Subjective:   Today, patient was seen and examined at bedside.  Denies any chest pain, shortness of breath, fever, chills or rigor.  Discharge Exam:   Vitals:   01/05/20 2134 01/06/20 0604  BP: 115/82 119/68  Pulse: 69 63  Resp:    Temp:  98 F (36.7 C)  SpO2: 96% 95%   Vitals:   01/05/20 1311 01/05/20 2134 01/05/20 2134 01/06/20 0604  BP: 120/75  115/82 119/68  Pulse: 75  69 63  Resp: 18     Temp: 98.4 F (36.9 C) 97.8 F (36.6 C)  98 F (36.7 C)  TempSrc: Oral Oral  Oral  SpO2: 93%  96% 95%  Weight:      Height:       General: Alert awake, not in obvious distress, obese HENT: pupils equally reacting to light,  No scleral pallor or icterus noted. Oral mucosa is moist.  Chest:  Clear breath sounds.  Diminished breath sounds bilaterally. No crackles or wheezes.  CVS: S1 &S2 heard. No murmur.  Regular rate and rhythm. Abdomen: Soft, nontender, nondistended.  Bowel sounds are heard.   Extremities: No cyanosis, clubbing or edema.  Peripheral pulses are palpable. Psych: Alert, awake and oriented, normal mood CNS:  No cranial nerve deficits.  Power equal in all extremities.   Skin: Warm and dry.  No rashes noted.  The results of significant diagnostics from this hospitalization (including imaging, microbiology, ancillary and laboratory) are listed below for reference.     Diagnostic Studies:   DG Chest Portable 1  View  Result Date: 01/02/2020 CLINICAL DATA:  Recent COVID diagnosis. EXAM: PORTABLE CHEST 1 VIEW COMPARISON:  None. FINDINGS: Decreased lung volumes are seen which is likely, in part, secondary to the degree of patient inspiration. Very mild, ill-defined infiltrates are seen within the bilateral lung bases. There is no evidence of a pleural effusion or pneumothorax. The heart size and mediastinal  contours are within normal limits. The visualized skeletal structures are unremarkable. IMPRESSION: Very mild, ill-defined bibasilar infiltrates. Electronically Signed   By: Aram Candela M.D.   On: 01/02/2020 22:26     Labs:   Basic Metabolic Panel: Recent Labs  Lab 01/02/20 2040 01/02/20 2040 01/03/20 0340 01/03/20 0340 01/04/20 0532 01/04/20 0532 01/05/20 0338 01/06/20 0340  NA 136  --  135  --  135  --  136 137  K 3.9   < > 4.0   < > 3.8   < > 4.4 4.6  CL 101  --  102  --  103  --  105 105  CO2 22  --  23  --  23  --  23 23  GLUCOSE 96  --  112*  --  160*  --  158* 142*  BUN 12  --  12  --  21*  --  23* 19  CREATININE 1.26*  --  1.23  --  1.14  --  1.12 1.07  CALCIUM 8.4*  --  8.4*  --  8.3*  --  8.4* 8.5*  MG  --   --  2.3  --   --   --   --   --    < > = values in this interval not displayed.   GFR Estimated Creatinine Clearance: 127.3 mL/min (by C-G formula based on SCr of 1.07 mg/dL). Liver Function Tests: Recent Labs  Lab 01/02/20 2040 01/03/20 0340 01/04/20 0532 01/05/20 0338 01/06/20 0340  AST 56* 52* 43* 38 35  ALT 31 28 31 31  33  ALKPHOS 31* 29* 34* 34* 30*  BILITOT 1.6* 1.5* 1.3* 0.9 0.9  PROT 7.8 7.6 7.5 7.1 7.2  ALBUMIN 3.8 3.6 3.3* 3.5 3.4*   No results for input(s): LIPASE, AMYLASE in the last 168 hours. No results for input(s): AMMONIA in the last 168 hours. Coagulation profile No results for input(s): INR, PROTIME in the last 168 hours.  CBC: Recent Labs  Lab 01/02/20 2040 01/03/20 0340  WBC 6.4 6.5  NEUTROABS 5.0 5.4  HGB 12.3* 12.0*  HCT 37.6* 37.0*  MCV 87.6 87.9  PLT 245 241   Cardiac Enzymes: No results for input(s): CKTOTAL, CKMB, CKMBINDEX, TROPONINI in the last 168 hours. BNP: Invalid input(s): POCBNP CBG: No results for input(s): GLUCAP in the last 168 hours. D-Dimer Recent Labs    01/05/20 0338 01/06/20 0340  DDIMER 0.59* 0.62*   Hgb A1c No results for input(s): HGBA1C in the last 72 hours. Lipid  Profile No results for input(s): CHOL, HDL, LDLCALC, TRIG, CHOLHDL, LDLDIRECT in the last 72 hours. Thyroid function studies No results for input(s): TSH, T4TOTAL, T3FREE, THYROIDAB in the last 72 hours.  Invalid input(s): FREET3 Anemia work up Recent Labs    01/05/20 0338 01/06/20 0333  FERRITIN 1,301* 981*   Microbiology Recent Results (from the past 240 hour(s))  Blood Culture (routine x 2)     Status: None (Preliminary result)   Collection Time: 01/02/20  8:40 PM   Specimen: BLOOD  Result Value Ref Range Status   Specimen Description   Final  BLOOD RIGHT ANTECUBITAL Performed at [facility], [address], [place], [place] [address]    Special Requests   Final    BOTTLES DRAWN AEROBIC AND ANAEROBIC Blood Culture adequate volume Performed at [facility], [address], [place], [place] [address]    Culture   Final    NO GROWTH 3 DAYS Performed at [facility], [address], [place], [place] [address]    Report Status PENDING  Incomplete  Blood Culture (routine x 2)     Status: None (Preliminary result)   Collection Time: 01/02/20  8:45 PM   Specimen: BLOOD  Result Value Ref Range Status   Specimen Description   Final    BLOOD LEFT ANTECUBITAL Performed at [facility], [address], [place], [place] [address]    Special Requests   Final    BOTTLES DRAWN AEROBIC AND ANAEROBIC Blood Culture adequate volume Performed at [facility], [address], [place], [place] [address]    Culture   Final    NO GROWTH 3 DAYS Performed at [facility], [address] [place], [place] [address]    Report Status PENDING  Incomplete     Discharge Instructions:   Discharge Instructions    Call MD for:  difficulty breathing, headache or visual disturbances   Complete by: As directed    Call MD for:  severe uncontrolled pain   Complete by: As directed    Call MD for:   temperature >100.4   Complete by: As directed    Diet general   Complete by: As directed    Discharge instructions   Complete by: As directed    Follow up with your primary care provider in one week. Seek medical attention for worsening symptoms. Total isolation duration 21 days from day of diagnosis. Ok to use over the counter cough medication and tylenol at home as needed.   Increase activity slowly   Complete by: As directed      Allergies as of 01/06/2020      Reactions   Penicillins    States he is allergic to liquid penicillins      Medication List    TAKE these medications   ascorbic acid 500 MG tablet Commonly known as: VITAMIN C Take 1 tablet (500 mg total) by mouth daily.   guaiFENesin-dextromethorphan 100-10 MG/5ML syrup Commonly known as: ROBITUSSIN DM Take 10 mLs by mouth every 4 (four) hours as needed for cough.   predniSONE 10 MG tablet Commonly known as: DELTASONE Take 4 tablets (40 mg total) by mouth daily with breakfast for 5 days.   zinc sulfate 220 (50 Zn) MG capsule Take 1 capsule (220 mg total) by mouth daily.       Follow-up Information    Primary care provider. Schedule an appointment as soon as possible for a visit in 1 week(s).                Time coordinating discharge: 39 minutes  Signed:  Irma Delancey  [facility] 01/06/2020, 1:26 PM

## 2020-01-06 NOTE — Progress Notes (Signed)
Discharge instructions reviewed with pt. Pt acknowledged understanding.

## 2020-01-08 LAB — CULTURE, BLOOD (ROUTINE X 2)
Culture: NO GROWTH
Culture: NO GROWTH
Special Requests: ADEQUATE
Special Requests: ADEQUATE

## 2021-10-28 IMAGING — DX DG CHEST 1V PORT
1 series · 1 of 1 positions shown · non-contrast
Comparison: None.

CLINICAL DATA: Recent COVID diagnosis.

EXAM:
PORTABLE CHEST 1 VIEW

[chest ap]
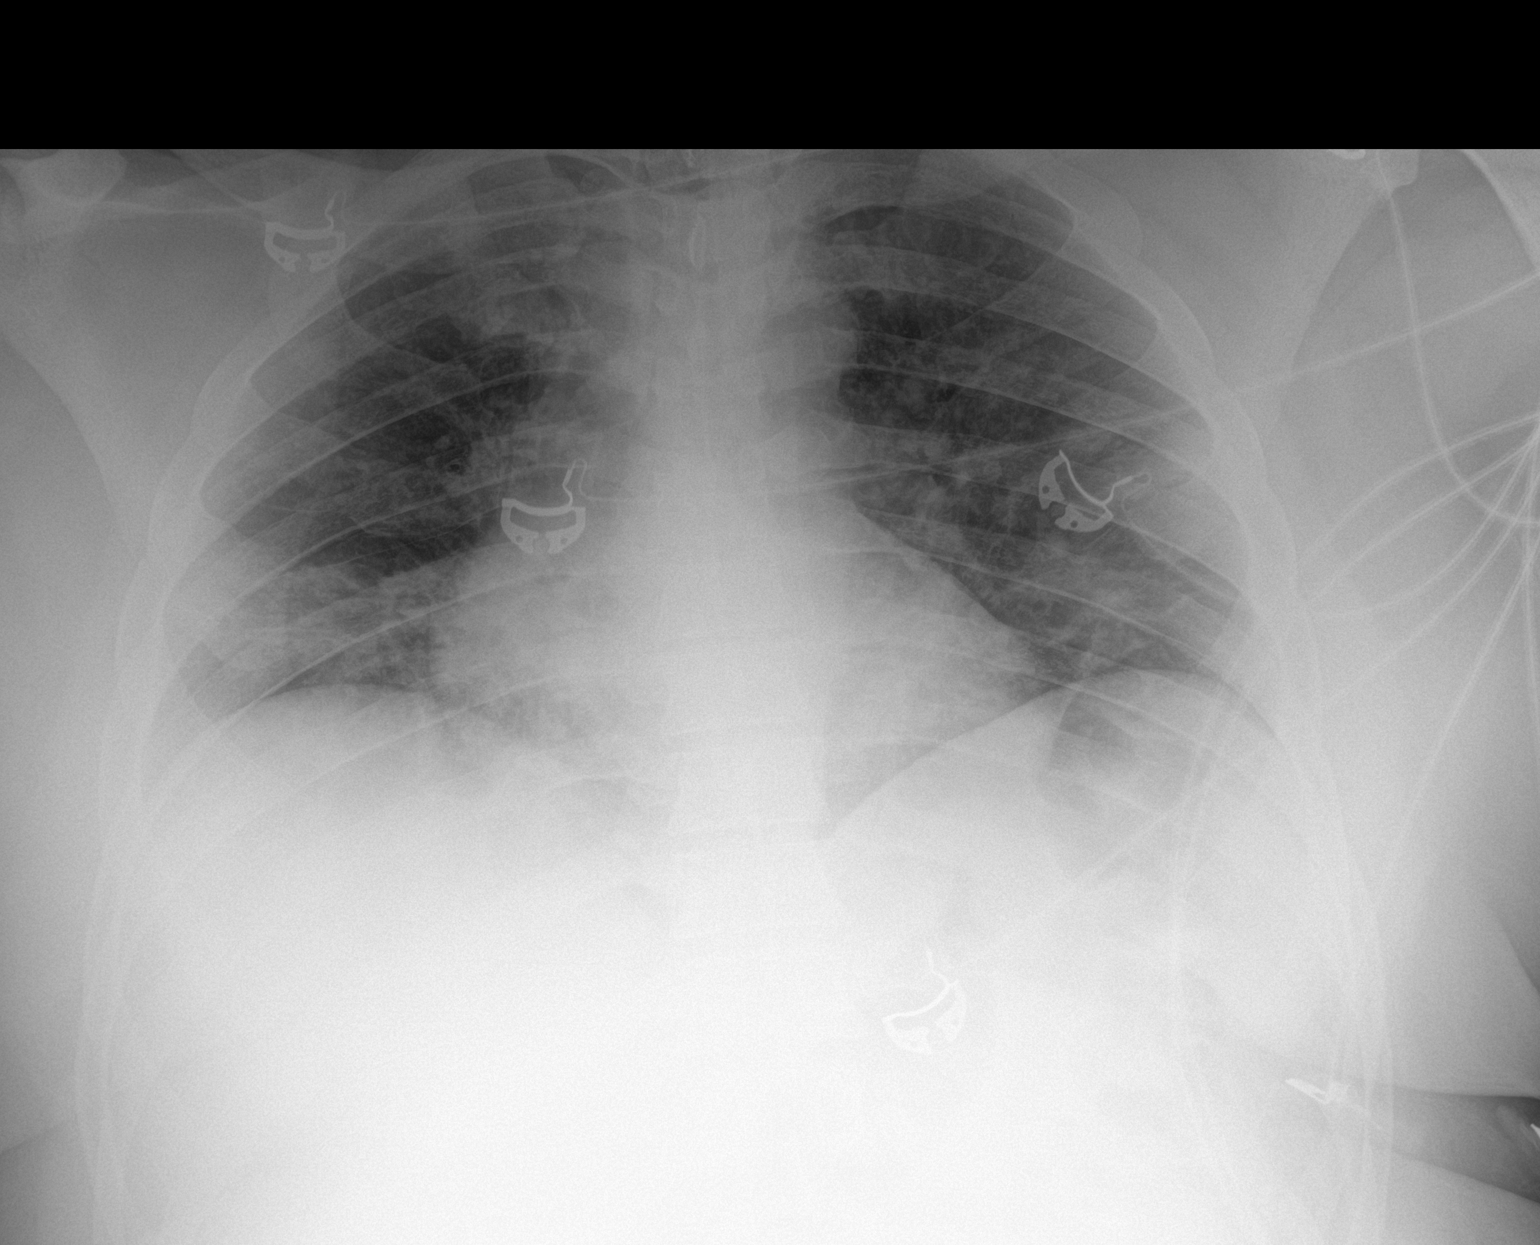

[1 of 1 positions shown; findings below may reference images not displayed]

FINDINGS: Decreased lung volumes are seen which is likely, in part, secondary
to the degree of patient inspiration. Very mild, ill-defined
infiltrates are seen within the bilateral lung bases. There is no
evidence of a pleural effusion or pneumothorax. The heart size and
mediastinal contours are within normal limits. The visualized
skeletal structures are unremarkable.
IMPRESSION: Very mild, ill-defined bibasilar infiltrates.
